# Patient Record
Sex: Male | Born: 1995
Health system: Southern US, Community
[De-identification: ages and names within clinical notes are randomized; demographics above are authoritative.]

## PROBLEM LIST (undated history)

## (undated) DIAGNOSIS — F329 Major depressive disorder, single episode, unspecified: Secondary | ICD-10-CM

## (undated) DIAGNOSIS — F419 Anxiety disorder, unspecified: Secondary | ICD-10-CM

## (undated) DIAGNOSIS — R51 Headache: Secondary | ICD-10-CM

## (undated) DIAGNOSIS — F32A Depression, unspecified: Secondary | ICD-10-CM

## (undated) DIAGNOSIS — R21 Rash and other nonspecific skin eruption: Secondary | ICD-10-CM

## (undated) DIAGNOSIS — R0600 Dyspnea, unspecified: Secondary | ICD-10-CM

## (undated) DIAGNOSIS — G43909 Migraine, unspecified, not intractable, without status migrainosus: Secondary | ICD-10-CM

## (undated) DIAGNOSIS — S62309A Unspecified fracture of unspecified metacarpal bone, initial encounter for closed fracture: Secondary | ICD-10-CM

## (undated) HISTORY — DX: Major depressive disorder, single episode, unspecified: F32.9

## (undated) HISTORY — DX: Depression, unspecified: F32.A

## (undated) HISTORY — DX: Unspecified fracture of unspecified metacarpal bone, initial encounter for closed fracture: S62.309A

## (undated) HISTORY — PX: NASAL SINUS SURGERY: SHX719

## (undated) HISTORY — DX: Anxiety disorder, unspecified: F41.9

## (undated) HISTORY — DX: Headache: R51

---
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
## 2016-02-25 ENCOUNTER — Emergency Department (HOSPITAL_COMMUNITY)
Admission: EM | Admit: 2016-02-25 | Discharge: 2016-02-25 | Disposition: A | Discharge status: Home / Self Care | Payer: Medicaid Other | Attending: Emergency Medicine | Admitting: Emergency Medicine

## 2016-02-25 ENCOUNTER — Encounter (HOSPITAL_COMMUNITY): Payer: Self-pay | Admitting: *Deleted

## 2016-02-25 DIAGNOSIS — R002 Palpitations: Secondary | ICD-10-CM | POA: Diagnosis present

## 2016-02-25 DIAGNOSIS — F1410 Cocaine abuse, uncomplicated: Secondary | ICD-10-CM | POA: Diagnosis not present

## 2016-02-25 DIAGNOSIS — Z79899 Other long term (current) drug therapy: Secondary | ICD-10-CM | POA: Diagnosis not present

## 2016-02-25 DIAGNOSIS — Z87891 Personal history of nicotine dependence: Secondary | ICD-10-CM | POA: Insufficient documentation

## 2016-02-25 DIAGNOSIS — F1290 Cannabis use, unspecified, uncomplicated: Secondary | ICD-10-CM | POA: Diagnosis not present

## 2016-02-25 LAB — CBC WITH DIFFERENTIAL/PLATELET
Basophils Absolute: 0 10*3/uL (ref 0.0–0.1)
Basophils Relative: 0 %
Eosinophils Absolute: 0 10*3/uL (ref 0.0–0.7)
Eosinophils Relative: 0 %
HCT: 44.5 % (ref 39.0–52.0)
Hemoglobin: 15.1 g/dL (ref 13.0–17.0)
Lymphocytes Relative: 6 %
Lymphs Abs: 1.2 10*3/uL (ref 0.7–4.0)
MCH: 27.5 pg (ref 26.0–34.0)
MCHC: 33.9 g/dL (ref 30.0–36.0)
MCV: 80.9 fL (ref 78.0–100.0)
Monocytes Absolute: 1.1 10*3/uL — ABNORMAL HIGH (ref 0.1–1.0)
Monocytes Relative: 6 %
Neutro Abs: 17.2 10*3/uL — ABNORMAL HIGH (ref 1.7–7.7)
Neutrophils Relative %: 88 %
Platelets: 230 10*3/uL (ref 150–400)
RBC: 5.5 MIL/uL (ref 4.22–5.81)
RDW: 15.9 % — ABNORMAL HIGH (ref 11.5–15.5)
WBC: 19.5 10*3/uL — ABNORMAL HIGH (ref 4.0–10.5)

## 2016-02-25 LAB — COMPREHENSIVE METABOLIC PANEL
ALT: 14 U/L — ABNORMAL LOW (ref 17–63)
AST: 20 U/L (ref 15–41)
Albumin: 4.5 g/dL (ref 3.5–5.0)
Alkaline Phosphatase: 72 U/L (ref 38–126)
Anion gap: 10 (ref 5–15)
BUN: 16 mg/dL (ref 6–20)
CO2: 23 mmol/L (ref 22–32)
Calcium: 9.5 mg/dL (ref 8.9–10.3)
Chloride: 103 mmol/L (ref 101–111)
Creatinine, Ser: 1.03 mg/dL (ref 0.61–1.24)
GFR calc Af Amer: >60 mL/min (ref >60)
GFR calc non Af Amer: >60 mL/min (ref >60)
Glucose, Bld: 97 mg/dL (ref 65–99)
Potassium: 3.5 mmol/L (ref 3.5–5.1)
Sodium: 136 mmol/L (ref 135–145)
Total Bilirubin: 0.5 mg/dL (ref 0.3–1.2)
Total Protein: 8.6 g/dL — ABNORMAL HIGH (ref 6.5–8.1)

## 2016-02-25 LAB — SALICYLATE LEVEL: Salicylate Lvl: <7 mg/dL (ref 2.8–30.0)

## 2016-02-25 LAB — ACETAMINOPHEN LEVEL: Acetaminophen (Tylenol), Serum: <10 ug/mL — ABNORMAL LOW (ref 10–30)

## 2016-02-25 LAB — MAGNESIUM: Magnesium: 1.8 mg/dL (ref 1.7–2.4)

## 2016-02-25 NOTE — ED Triage Notes (Signed)
Pt states that he consumed cocaine around 3-4pm and then he had some cough syrup after which he became very jittery and anxious and his heart began racing.  Pt reports that he feels that his chest is burning.

## 2016-02-25 NOTE — ED Provider Notes (Signed)
[facility] DEPT Provider Note   CSN: [id number] Arrival date & time: 02/25/16  1802     History   Chief Complaint Chief Complaint  Patient presents with  . Palpitations    HPI Jorge Mckinney is a 20 y.o. male.  HPI  The patient is a 20 year old male, he has a history of substance abuse stating that he drinks cough syrup cough syrup occasionally but also uses cocaine and states that today between the hours of 4 and 5:00 PM he took between 5 and 6 bumps of cocaine which he inhaled through his nose, he drank half a bottle of Delsym cough syrup and smoked some marijuana. He reports that he was doing this to get high, he does have a history of using drugs and has experimented with some oral medications in the past as well though he denies any of that this evening. He denies hallucinations at this time, denies suicidality, denies any depression and other than feeling like he is having palpitations he has no other complaints. The palpitations are constant, gradually improving, not associated with chest pain difficulty breathing or headache or loss of consciousness.  Past Medical History:  Diagnosis Date  . Anxiety   . Depression   . Headache(784.0)   . Metacarpal bone fracture October 2012   after punching a wall, saw Dr. Lenon Curt    Patient Active Problem List   Diagnosis Date Noted  . Anxiety state, unspecified 11/08/2013  . Auditory hallucinations 11/08/2013    Past Surgical History:  Procedure Laterality Date  . NASAL SINUS SURGERY         Home Medications    Prior to Admission medications   Medication Sig Start Date End Date Taking? Authorizing Provider  dextromethorphan (DELSYM) 30 MG/5ML liquid Take 30 mg by mouth daily as needed for cough.   Yes Historical Provider, MD    Family History Family History  Problem Relation Age of Onset  . Depression Father     Social History Social History  Substance Use Topics  . Smoking status: Former Research scientist (life sciences)  .  Smokeless tobacco: Never Used  . Alcohol use No     Allergies   Patient has no known allergies.   Review of Systems Review of Systems  All other systems reviewed and are negative.    Physical Exam Updated Vital Signs BP 122/68   Pulse 77   Temp 98.3 F (36.8 C) (Oral)   Resp 20   Wt 140 lb (63.5 kg)   SpO2 99%   BMI 20.09 kg/m   Physical Exam  Constitutional: He appears well-developed and well-nourished. No distress.  HENT:  Head: Normocephalic and atraumatic.  Mouth/Throat: Oropharynx is clear and moist. No oropharyngeal exudate.  Eyes: Conjunctivae and EOM are normal. Pupils are equal, round, and reactive to light. Right eye exhibits no discharge. Left eye exhibits no discharge. No scleral icterus.  Neck: Normal range of motion. Neck supple. No JVD present. No thyromegaly present.  Cardiovascular: Normal rate, regular rhythm, normal heart sounds and intact distal pulses.  Exam reveals no gallop and no friction rub.   No murmur heard. Heart rate of 95 on my exam with strong pulses peripherally, no JVD, no edema, no murmurs  Pulmonary/Chest: Effort normal and breath sounds normal. No respiratory distress. He has no wheezes. He has no rales.  Abdominal: Soft. Bowel sounds are normal. He exhibits no distension and no mass. There is no tenderness.  Musculoskeletal: Normal range of motion. He exhibits no edema or  tenderness.  Lymphadenopathy:    He has no cervical adenopathy.  Neurological: He is alert. Coordination normal.  The patient is alert but tired appearing, follows commands without difficulty, speech is not slurred but clear  Skin: Skin is warm and dry. No rash noted. No erythema.  Psychiatric: He has a normal mood and affect. His behavior is normal.  Nursing note and vitals reviewed.    ED Treatments / Results  Labs (all labs ordered are listed, but only abnormal results are displayed) Labs Reviewed  ACETAMINOPHEN LEVEL - Abnormal; Notable for the  following:       Result Value   Acetaminophen (Tylenol), Serum <10 (*)    All other components within normal limits  COMPREHENSIVE METABOLIC PANEL - Abnormal; Notable for the following:    Total Protein 8.6 (*)    ALT 14 (*)    All other components within normal limits  CBC WITH DIFFERENTIAL/PLATELET - Abnormal; Notable for the following:    WBC 19.5 (*)    RDW 15.9 (*)    Neutro Abs 17.2 (*)    Monocytes Absolute 1.1 (*)    All other components within normal limits  SALICYLATE LEVEL  MAGNESIUM    EKG  EKG Interpretation  Date/Time:  Monday February 25 2016 18:14:00 EST Ventricular Rate:  118 PR Interval:  116 QRS Duration: 76 QT Interval:  380 QTC Calculation: 532 R Axis:   85 Text Interpretation:  Sinus tachycardia Right atrial enlargement Prolonged QT Abnormal ECG No old tracing to compare Confirmed by Tyrice Hewitt  MD, [place] ([id number]) on 02/25/2016 6:53:15 PM       EKG Interpretation  Date/Time:  Monday February 25 2016 22:08:52 EST Ventricular Rate:  80 PR Interval:  116 QRS Duration: 77 QT Interval:  386 QTC Calculation: 446 R Axis:   82 Text Interpretation:  Sinus rhythm RSR' in V1 or V2, probably normal variant Nonspecific T abnrm, anterolateral leads Baseline wander in lead(s) V4 Since last tracing rate slower and QT has shortened to normal length. Confirmed by Sabra Heck  MD, [place] ([id number]) on 02/25/2016 10:23:21 PM        Radiology No results found.  Procedures Procedures (including critical care time)  Medications Ordered in ED Medications - No data to display   Initial Impression / Assessment and Plan / ED Course  I have reviewed the triage vital signs and the nursing notes.  Pertinent labs & imaging results that were available during my care of the patient were reviewed by me and considered in my medical decision making (see chart for details).  Clinical Course     The patient has an abnormal EKG, there is a prolonged QT with a rate of 118 and a QTC  of 532. The patient will need to have his care discussed with the [facility], he'll stay on a cardiac monitor, he is not hallucinating and this was not a suicide attempt. Labs pending  [facility] recommended observatin and repeat ECG Repeat ECG has improved QT interval - back to normal  Pt stable for d/c Given resources for substance abuse treatment.  Final Clinical Impressions(s) / ED Diagnoses   Final diagnoses:  Palpitations  Cocaine abuse    New Prescriptions Discharge Medication List as of 02/25/2016 10:25 PM       Noemi Chapel, MD 02/26/16 [phone]

## 2016-02-25 NOTE — ED Notes (Signed)
Moved pt to room per EDP after EKG was given to Dr. Sabra Heck.  Pt is in no acute distress.  Mother at the bedside with him

## 2016-02-25 NOTE — Discharge Instructions (Signed)
Stop using drugs See the list below for follow up for substance abuse.  Substance Abuse Treatment Programs  Intensive Outpatient Programs [facility]     [address], [place]       [facility] [address] [place], [place]  [facility]     (Inpatient and outpatient)     [address]           [place] [phone] (Suboxone and Methadone)  [place], [place] [address]      [place] [address] [phone] [place], [place]  [facility] (Outpatient/Inpatient, [place])    (insurance only) [phone]             [facility] ([place]) [place], [place]     [facility]     [address]     [place], [place]       [facility] (for caregivers and family) [phone] [address] Kristeen Mans. [place], [place]      Residential Treatment Programs [facility]      [address] [place], [place] [address]  [phone]       [facility] [address] [place], [place] [address] [phone]  [facility]        [phone]       [facility] [phone]  [facility] ([place]             [place], [place] or [place] of [place], [address] [phone]  [facility]    [address]      [place], [place]       [facility] [address] [place], [place]  [place]   [address] [place], [place] [address]     [phone]      Admissions: 8am-3pm  M-F  [facility] (RTS) [address] [place], [place]  [facility] Program: Residential Program [phone] Days)   [place], [place] or [phone]     [facility]: [facility] [place], [place] (Walk in Hours over the weekend or by referral)  [facility] [place], [place], [place] [address] [phone]  Crisis Mobile: [facility]:  [phone] (for crisis response 24 hours a day) Anon [place]:  [phone] Outpatient Psychiatry and Counseling  [facility]: Mobile Crisis Management 24 hours:  [phone]  [facility] the [employer] sliding scale fee and walk in schedule: M-F 8am-12pm/1pm-3pm [place], [place] [address] [place], [place] [address] [phone]  [facility] ([facility])- new patient walk-in appointments available Monday - Friday 8am -3pm.          [address] [place], [place] [address] [phone] or crisis line- [facility] [facility]/ Intensive Outpatient Therapy Program [place], [place] [address] [place]      [phone] [address] [place], [place] [address]                 [place]   [facility] [phone]. [address] [place], [place] [address]   [employer] of Care          [address] Johnette Abraham  [place], [place] [address]       [phone]  [facility] [address], [place], [place] [address] [phone]  [facility]    [address] [place], [place] [address]     [place], [place] Joycelyn Man     [place]  [address]     [phone]       [facility] [place] [address]  [facility]     [address], [place], [place], [place] [address] [place]     [address]     [place], [place] [address]     [phone]       [facility] [address] [place], [place] [address] [phone] Resources for Temporary Residential Assistance/Crisis [place] [facility]) M-F 8am-3pm   [address], [place] [address]   [phone] Services include: laundry, barbering, support groups, case management, phone  & computer access, showers, AA/NA mtgs, mental health/substance abuse nurse, job skills class, disability information, VA assistance, spiritual classes, etc.   [facility]   [address], [place]     [place]              [employer] (women and children)       [place]. [place], [place] [address] [phone] [email or web address] for application and process Application Required  [facility]   [address]    [place] [address]     [phone]                    [place], [place] [address] [id number] application appt.) Application Required  [employer] (women only)    [place], [place]  [place]      Intake starts 6pm daily Need valid ID, SSC, & Police report [employer] [address] [place], [place] [id number] Application Required  [employer] (men only)     [place]      [place], [place]       [place] (Pregnant  women only) [address] [place], [place]  [facility]      [place] [address]      [place], [place] [address]     [phone]             [facility] [address] [place], [place] 90 day commitment/SA/Application process  [facility](men only)     [address]     [place], [place]       Check-in at [facility] [address] [place], [place] [address] [phone] Men/Women/Women and Children must be there by 7 pm  [place], [place]

## 2016-02-25 NOTE — ED Notes (Signed)
Cecille Rubin from [employer] called for patient update

## 2016-02-25 NOTE — ED Notes (Signed)
Spoke with Jorge Mckinney from [employer], she stated pt would need to be observed for 4 more hours (6-8 hours total after initial ingestion), pt could be given benzos for any agitation, a magnesium level should be drawn, another EKG 3 hours after first EKG done to make sure QT was less than 500; pt is encouraged to drink fluids;  Dr. Sabra Heck informed of Poison Control orders

## 2016-03-31 DIAGNOSIS — S060X9A Concussion with loss of consciousness of unspecified duration, initial encounter: Secondary | ICD-10-CM

## 2016-03-31 DIAGNOSIS — S060XAA Concussion with loss of consciousness status unknown, initial encounter: Secondary | ICD-10-CM

## 2016-03-31 HISTORY — DX: Concussion with loss of consciousness of unspecified duration, initial encounter: S06.0X9A

## 2016-03-31 HISTORY — DX: Concussion with loss of consciousness status unknown, initial encounter: S06.0XAA

## 2016-04-08 ENCOUNTER — Emergency Department (HOSPITAL_COMMUNITY)
Admission: EM | Admit: 2016-04-08 | Discharge: 2016-04-08 | Disposition: A | Discharge status: Home / Self Care | Payer: Medicaid Other | Attending: Emergency Medicine | Admitting: Emergency Medicine

## 2016-04-08 ENCOUNTER — Encounter (HOSPITAL_COMMUNITY): Payer: Self-pay | Admitting: Emergency Medicine

## 2016-04-08 DIAGNOSIS — R531 Weakness: Secondary | ICD-10-CM | POA: Diagnosis not present

## 2016-04-08 DIAGNOSIS — F1290 Cannabis use, unspecified, uncomplicated: Secondary | ICD-10-CM | POA: Diagnosis not present

## 2016-04-08 DIAGNOSIS — Z87891 Personal history of nicotine dependence: Secondary | ICD-10-CM | POA: Diagnosis not present

## 2016-04-08 DIAGNOSIS — Z5181 Encounter for therapeutic drug level monitoring: Secondary | ICD-10-CM | POA: Diagnosis not present

## 2016-04-08 LAB — URINALYSIS, ROUTINE W REFLEX MICROSCOPIC
Bilirubin Urine: NEGATIVE
Glucose, UA: NEGATIVE mg/dL
Hgb urine dipstick: NEGATIVE
Ketones, ur: 5 mg/dL — AB
Leukocytes, UA: NEGATIVE
Nitrite: NEGATIVE
Protein, ur: NEGATIVE mg/dL
Specific Gravity, Urine: 1.023 (ref 1.005–1.030)
pH: 6 (ref 5.0–8.0)

## 2016-04-08 LAB — BASIC METABOLIC PANEL
Anion gap: 11 (ref 5–15)
BUN: 12 mg/dL (ref 6–20)
CO2: 24 mmol/L (ref 22–32)
Calcium: 9.8 mg/dL (ref 8.9–10.3)
Chloride: 100 mmol/L — ABNORMAL LOW (ref 101–111)
Creatinine, Ser: 1.08 mg/dL (ref 0.61–1.24)
GFR calc Af Amer: >60 mL/min (ref >60)
GFR calc non Af Amer: >60 mL/min (ref >60)
Glucose, Bld: 87 mg/dL (ref 65–99)
Potassium: 3.6 mmol/L (ref 3.5–5.1)
Sodium: 135 mmol/L (ref 135–145)

## 2016-04-08 LAB — CBC WITH DIFFERENTIAL/PLATELET
Basophils Absolute: 0 10*3/uL (ref 0.0–0.1)
Basophils Relative: 0 %
Eosinophils Absolute: 0.1 10*3/uL (ref 0.0–0.7)
Eosinophils Relative: 1 %
HCT: 49.9 % (ref 39.0–52.0)
Hemoglobin: 17.2 g/dL — ABNORMAL HIGH (ref 13.0–17.0)
Lymphocytes Relative: 16 %
Lymphs Abs: 1.5 10*3/uL (ref 0.7–4.0)
MCH: 27.7 pg (ref 26.0–34.0)
MCHC: 34.5 g/dL (ref 30.0–36.0)
MCV: 80.4 fL (ref 78.0–100.0)
Monocytes Absolute: 0.9 10*3/uL (ref 0.1–1.0)
Monocytes Relative: 9 %
Neutro Abs: 6.9 10*3/uL (ref 1.7–7.7)
Neutrophils Relative %: 74 %
Platelets: 223 10*3/uL (ref 150–400)
RBC: 6.21 MIL/uL — ABNORMAL HIGH (ref 4.22–5.81)
RDW: 15.3 % (ref 11.5–15.5)
WBC: 9.3 10*3/uL (ref 4.0–10.5)

## 2016-04-08 LAB — RAPID URINE DRUG SCREEN, HOSP PERFORMED
Amphetamines: NOT DETECTED
Barbiturates: NOT DETECTED
Benzodiazepines: NOT DETECTED
Cocaine: NOT DETECTED
Opiates: NOT DETECTED
Tetrahydrocannabinol: POSITIVE — AB

## 2016-04-08 LAB — CK: Total CK: 146 U/L (ref 49–397)

## 2016-04-08 MED ORDER — SODIUM CHLORIDE 0.9 % IV BOLUS (SEPSIS)
1000.0000 mL | Freq: Once | INTRAVENOUS | Status: AC
  Administered 2016-04-08: 1000 mL via INTRAVENOUS

## 2016-04-08 NOTE — ED Provider Notes (Signed)
[facility] DEPT Provider Note   CSN: [id number] Arrival date & time: 04/08/16  1759     History   Chief Complaint Chief Complaint  Patient presents with  . Weakness    HPI LADARIAN TUR is a 21 y.o. male.  HPI   The patient is a 21 year old male, he does have a history of anxiety and depression as well as a history of substance abuse. I personally saw this patient approximately 5-6 weeks ago at which time the patient had been using cocaine. He has not used since that time but has been smoking marijuana, he most recently smoked 2 days ago after which time he felt jittery and has had generalized fatigue. This generalized fatigue is expressed his general weakness, he reports that his arms and felt weak, he notes that these arms are persistently weak. He denies any numbness in the arms or the legs, no difficulty walking other than feeling generally weak and lightheaded. He denies any difficulty using his legs, difficulty using his speech, difficulty with vision. He has been able to eat and drink okay. He states that after his encounter with cocaine during the last visit he went to see day Elta Guadeloupe to try to get help and states that he was discouraged and did not go back, he did not seek further help and has not gone inpatient or started an outpatient treatment program.  The patient denies hallucinations or worsening depression.  Past Medical History:  Diagnosis Date  . Anxiety   . Depression   . Headache(784.0)   . Metacarpal bone fracture October 2012   after punching a wall, saw Dr. Lenon Curt    Patient Active Problem List   Diagnosis Date Noted  . Anxiety state, unspecified 11/08/2013  . Auditory hallucinations 11/08/2013    Past Surgical History:  Procedure Laterality Date  . NASAL SINUS SURGERY         Home Medications    Prior to Admission medications   Medication Sig Start Date End Date Taking? Authorizing Provider  dextromethorphan (DELSYM) 30 MG/5ML liquid Take  30 mg by mouth daily as needed for cough.    Historical Provider, MD    Family History Family History  Problem Relation Age of Onset  . Depression Father     Social History Social History  Substance Use Topics  . Smoking status: Former Research scientist (life sciences)  . Smokeless tobacco: Never Used  . Alcohol use Yes     Comment: occasionally     Allergies   Patient has no known allergies.   Review of Systems Review of Systems  All other systems reviewed and are negative.    Physical Exam Updated Vital Signs BP 120/73   Pulse 63   Temp 97.7 F (36.5 C) (Oral)   Resp 16   Ht 5\' 6"  (1.676 m)   Wt 140 lb (63.5 kg)   SpO2 100%   BMI 22.60 kg/m   Physical Exam  Constitutional: He appears well-developed and well-nourished. No distress.  HENT:  Head: Normocephalic and atraumatic.  Mouth/Throat: Oropharynx is clear and moist. No oropharyngeal exudate.  Eyes: Conjunctivae and EOM are normal. Pupils are equal, round, and reactive to light. Right eye exhibits no discharge. Left eye exhibits no discharge. No scleral icterus.  Neck: Normal range of motion. Neck supple. No JVD present. No thyromegaly present.  Cardiovascular: Normal rate, regular rhythm, normal heart sounds and intact distal pulses.  Exam reveals no gallop and no friction rub.   No murmur heard. Pulmonary/Chest: Effort  normal and breath sounds normal. No respiratory distress. He has no wheezes. He has no rales.  Abdominal: Soft. Bowel sounds are normal. He exhibits no distension and no mass. There is no tenderness.  Musculoskeletal: Normal range of motion. He exhibits no edema or tenderness.  Lymphadenopathy:    He has no cervical adenopathy.  Neurological: He is alert. Coordination normal.  Speech is clear, cranial nerves III through XII are intact, memory is intact, strength is normal in all 4 extremities including grips, sensation is intact to light touch and pinprick in all 4 extremities. Coordination as tested by  finger-nose-finger is normal, no limb ataxia. Normal gait, normal reflexes at the patellar tendons bilaterally  Skin: Skin is warm and dry. No rash noted. No erythema.  Psychiatric: He has a normal mood and affect. His behavior is normal.  Nursing note and vitals reviewed.    ED Treatments / Results  Labs (all labs ordered are listed, but only abnormal results are displayed) Labs Reviewed  CBC WITH DIFFERENTIAL/PLATELET - Abnormal; Notable for the following:       Result Value   RBC 6.21 (*)    Hemoglobin 17.2 (*)    All other components within normal limits  BASIC METABOLIC PANEL - Abnormal; Notable for the following:    Chloride 100 (*)    All other components within normal limits  RAPID URINE DRUG SCREEN, HOSP PERFORMED - Abnormal; Notable for the following:    Tetrahydrocannabinol POSITIVE (*)    All other components within normal limits  URINALYSIS, ROUTINE W REFLEX MICROSCOPIC - Abnormal; Notable for the following:    Ketones, ur 5 (*)    All other components within normal limits  CK     Radiology No results found.  Procedures Procedures (including critical care time)  Medications Ordered in ED Medications  sodium chloride 0.9 % bolus 1,000 mL (1,000 mLs Intravenous New Bag/Given 04/08/16 1929)  sodium chloride 0.9 % bolus 1,000 mL (0 mLs Intravenous Stopped 04/08/16 2024)     Initial Impression / Assessment and Plan / ED Course  I have reviewed the triage vital signs and the nursing notes.  Pertinent labs & imaging results that were available during my care of the patient were reviewed by me and considered in my medical decision making (see chart for details).  Clinical Course     The patient's exam is unremarkable, he has a normal neurologic exam with totally normal strength of his bilateral upper extremities with normal grips, normal strength at the shoulders elbows and wrists, normal strength at the hips knees and ankles. He has a normal cardiac and pulmonary  exam. His mental status is normal, his affect is slightly flat but similar to when I last saw him.  He does not have any myalgias, no fever, no other symptoms other than some mild diarrhea today. At this time the patient will be evaluated with labs to evaluate for his fatigue and generalized weakness, check urinalysis and give some gentle hydration. The patient is in agreement, he appears well and has no abnormal vital signs. I have again counseled the patient regarding substance abuse.   Labs neg Pt stable Hydration given Return as needed Psych recommendations given and accepted   Final Clinical Impressions(s) / ED Diagnoses   Final diagnoses:  Weakness    New Prescriptions New Prescriptions   No medications on file     Noemi Chapel, MD 04/08/16 2036

## 2016-04-08 NOTE — Discharge Instructions (Signed)
Your testing was normal Please see attached list for substance abuse Please avoid using drugs including Marijuana  Please obtain all of your results from medical records or have your doctors office obtain the results - share them with your doctor - you should be seen at your doctors office in the next 2 days. Call today to arrange your follow up. Take the medications as prescribed. Please review all of the medicines and only take them if you do not have an allergy to them. Please be aware that if you are taking birth control pills, taking other prescriptions, ESPECIALLY ANTIBIOTICS may make the birth control ineffective - if this is the case, either do not engage in sexual activity or use alternative methods of birth control such as condoms until you have finished the medicine and your family doctor says it is OK to restart them. If you are on a blood thinner such as COUMADIN, be aware that any other medicine that you take may cause the coumadin to either work too much, or not enough - you should have your coumadin level rechecked in next 7 days if this is the case.  ?  It is also a possibility that you have an allergic reaction to any of the medicines that you have been prescribed - Everybody reacts differently to medications and while MOST people have no trouble with most medicines, you may have a reaction such as nausea, vomiting, rash, swelling, shortness of breath. If this is the case, please stop taking the medicine immediately and contact your physician.  ?  You should return to the ER if you develop severe or worsening symptoms.   Substance Abuse Treatment Programs  Intensive Outpatient Programs [facility]     [address], [place]       [facility] [address] [place], [place]  [facility]     (Inpatient and outpatient)     [address]  Dr.           [place] [phone] (Suboxone and Methadone)  [place], [place] [address]      [place] [phone] [place], [place]  [facility] (Outpatient/Inpatient, Chemical)    (insurance only) [phone]             [facility] ([place]) [place], [place]     [facility]     [address]     [place], [place]       [facility] (for caregivers and family) [phone] [address] Kristeen Mans. [place], [place]      Residential Treatment Programs [facility]      [address], [place], [place] [address]  [phone]       [facility] [address], [place], [place] [address] [phone]  [facility]        [phone]       [facility] [phone]  [facility] ([place]             [address]  [place], [place] or [place], [address] [phone]  [facility]    [address]      [place], [place]       [facility] [address] [place], [place]  [place]   [address] [place], [place] [address]     [phone]      Admissions: 8am-3pm M-F  [facility] (RTS) [address] [place], [place]  [facility] Program: Residential Program [phone] Days)   [place], [place] or [phone]     [facility]: [place], [place] (Walk in Hours over the weekend or by referral)  [facility] [place], [place], [place] [address] [phone]  Crisis Mobile: Therapeutic  Alternatives:  [phone] (for crisis response 24 hours a day) [facility] Hotline:      [phone] Outpatient Psychiatry and Counseling  [facility]: Mobile Crisis Management 24 hours:  [phone]  [facility] of the [employer] sliding scale fee and walk in schedule: M-F 8am-12pm/1pm-3pm [place], [place] [address] [place], [place] [address] [phone]  [facility] (Formerly known as [facility] [employer])- new patient walk-in appointments available Monday - Friday 8am -3pm.          [address] [place], [place] [address] [phone] or crisis line- [facility]/ Intensive Outpatient Therapy Program [place], [place] [address] [place]      [phone] [address] [place], [place] [address]                 [place]   [facility] [phone]. [address] [place], [place] [address]   [employer] of Care          [address] Johnette Abraham  [place], [place] [address]       [phone]  [facility] [address] [place], [place] [address] [phone]  [facility]    [address] [place], [place] [address]     [place], [place] Joycelyn Man     [place] [address]     [phone]       [facility] [place] [address]  [facility]     [address], [place], [place]  [place], [place] [address] [place]     [address]     [place], [place]  [address]     [phone]       [facility] [address] [place], [place] [address] [phone] Resources for Temporary Residential Assistance/Crisis [facility]) M-F 8am-3pm   [address] [place], [place] [address]   [phone] Services include: laundry, barbering, support groups, case management, phone  & computer access, showers, AA/NA mtgs, mental health/substance abuse nurse, job skills class, disability information, VA assistance, spiritual classes, etc.   [facility]   [address] [place]     [place]              [employer] (women and children)       [place]. [place], [place] [address] [phone] [email or web address] for application and process Application Required  [facility]   [address]    [place] [address]     [phone]                    [place], [place] [address] [phone]([email or web address] application appt.) Application Required  [facility] (women only)    [address]     [place], [place] [address]     [phone]      Intake starts 6pm daily Need valid ID, SSC, & Police report [employer] [address] [place], [place] [phone] Application Required  [employer] (men only)     [place].      [place], [place]       [place] (Pregnant women only) [address] [place], [place]  [facility]      [place] [address].      [place], [place] [address]     [phone]             [facility] [address] [place], [place] 90 day commitment/SA/Application process  [facility](men only)     [address]     [place],  [place]       Check-in at [facility] of [facility] [address] [place],  [address] [phone] Men/Women/Women and Children must be there by 7 pm  [place], [place]

## 2016-04-08 NOTE — ED Triage Notes (Signed)
Patient complaining of weakness x 2 days. Mother states "he smoked some weed 2 days ago and was shaking and ever since he's been feeling weak." Denies pain at this time.

## 2016-04-08 NOTE — ED Notes (Signed)
ED Provider at bedside. 

## 2016-04-17 ENCOUNTER — Emergency Department (HOSPITAL_COMMUNITY): Payer: Medicaid Other

## 2016-04-17 ENCOUNTER — Encounter (HOSPITAL_COMMUNITY): Payer: Self-pay | Admitting: *Deleted

## 2016-04-17 ENCOUNTER — Emergency Department (HOSPITAL_COMMUNITY)
Admission: EM | Admit: 2016-04-17 | Discharge: 2016-04-17 | Disposition: A | Discharge status: Home / Self Care | Payer: Medicaid Other | Attending: Emergency Medicine | Admitting: Emergency Medicine

## 2016-04-17 DIAGNOSIS — J069 Acute upper respiratory infection, unspecified: Secondary | ICD-10-CM | POA: Diagnosis not present

## 2016-04-17 DIAGNOSIS — R509 Fever, unspecified: Secondary | ICD-10-CM | POA: Diagnosis present

## 2016-04-17 DIAGNOSIS — Z79899 Other long term (current) drug therapy: Secondary | ICD-10-CM | POA: Diagnosis not present

## 2016-04-17 DIAGNOSIS — Z87891 Personal history of nicotine dependence: Secondary | ICD-10-CM | POA: Insufficient documentation

## 2016-04-17 DIAGNOSIS — F1290 Cannabis use, unspecified, uncomplicated: Secondary | ICD-10-CM | POA: Insufficient documentation

## 2016-04-17 DIAGNOSIS — F1490 Cocaine use, unspecified, uncomplicated: Secondary | ICD-10-CM | POA: Insufficient documentation

## 2016-04-17 LAB — RAPID STREP SCREEN (MED CTR MEBANE ONLY): Streptococcus, Group A Screen (Direct): NEGATIVE

## 2016-04-17 NOTE — Discharge Instructions (Signed)
Please read and follow all provided instructions.  Your diagnoses today include:  1. Upper respiratory tract infection, unspecified type     Tests performed today include: Vital signs. See below for your results today.   Medications prescribed:  Take as prescribed   Home care instructions:  Follow any educational materials contained in this packet.  Follow-up instructions: Please follow-up with your primary care provider for further evaluation of symptoms and treatment   Return instructions:  Please return to the Emergency Department if you do not get better, if you get worse, or new symptoms OR  - Fever (temperature greater than 101.44F)  - Bleeding that does not stop with holding pressure to the area    -Severe pain (please note that you may be more sore the day after your accident)  - Chest Pain  - Difficulty breathing  - Severe nausea or vomiting  - Inability to tolerate food and liquids  - Passing out  - Skin becoming red around your wounds  - Change in mental status (confusion or lethargy)  - New numbness or weakness    Please return if you have any other emergent concerns.  Additional Information:  Your vital signs today were: BP 122/75    Pulse 69    Temp 100.4 F (38 C) (Oral)    Resp 16    Ht 6' (1.829 m)    Wt 63.5 kg    SpO2 99%    BMI 18.99 kg/m  If your blood pressure (BP) was elevated above 135/85 this visit, please have this repeated by your doctor within one month. ---------------

## 2016-04-17 NOTE — ED Provider Notes (Signed)
[facility] Provider Note   CSN: [id number] Arrival date & time: 04/17/16  [id number]     History   Chief Complaint Chief Complaint  Patient presents with  . Fever    HPI Jorge Mckinney is a 21 y.o. male.  HPI  21 y.o. male, presents to the Emergency Department today complaining of fever, cough, body aches x 2 days. Associated sore throat. No N/V/D. No CP/SOB/ABD pain. No sick contacts. No hx immunosuppression. No other symptoms noted   Past Medical History:  Diagnosis Date  . Anxiety   . Depression   . Headache(784.0)   . Metacarpal bone fracture October 2012   after punching a wall, saw Dr. Lenon Curt    Patient Active Problem List   Diagnosis Date Noted  . Anxiety state, unspecified 11/08/2013  . Auditory hallucinations 11/08/2013    Past Surgical History:  Procedure Laterality Date  . NASAL SINUS SURGERY         Home Medications    Prior to Admission medications   Medication Sig Start Date End Date Taking? Authorizing Provider  dextromethorphan (DELSYM) 30 MG/5ML liquid Take 30 mg by mouth daily as needed for cough.    Historical Provider, MD    Family History Family History  Problem Relation Age of Onset  . Depression Father     Social History Social History  Substance Use Topics  . Smoking status: Former Research scientist (life sciences)  . Smokeless tobacco: Never Used  . Alcohol use Yes     Comment: occasionally     Allergies   Patient has no known allergies.   Review of Systems Review of Systems  Constitutional: Positive for fever.  HENT: Positive for congestion and rhinorrhea.   Respiratory: Positive for cough. Negative for shortness of breath.   Cardiovascular: Negative for chest pain.  Gastrointestinal: Negative for diarrhea, nausea and vomiting.  Allergic/Immunologic: Negative for immunocompromised state.     Physical Exam Updated Vital Signs BP 122/75   Pulse 69   Temp 100.4 F (38 C) (Oral)   Resp 16   Ht 6' (1.829 m)   Wt 63.5 kg   SpO2  99%   BMI 18.99 kg/m   Physical Exam  Constitutional: He is oriented to person, place, and time. He appears well-developed and well-nourished. No distress.  HENT:  Head: Normocephalic and atraumatic.  Right Ear: Tympanic membrane, external ear and ear canal normal.  Left Ear: Tympanic membrane, external ear and ear canal normal.  Nose: Nose normal.  Mouth/Throat: Uvula is midline and mucous membranes are normal. No trismus in the jaw. Posterior oropharyngeal erythema present. No oropharyngeal exudate or tonsillar abscesses.  Eyes: EOM are normal. Pupils are equal, round, and reactive to light.  Neck: Normal range of motion. Neck supple. No tracheal deviation present.  Cardiovascular: Normal rate, regular rhythm, S1 normal, S2 normal, normal heart sounds, intact distal pulses and normal pulses.   Pulmonary/Chest: Effort normal and breath sounds normal. No respiratory distress. He has no decreased breath sounds. He has no wheezes. He has no rhonchi. He has no rales.  Abdominal: Normal appearance and bowel sounds are normal. There is no tenderness.  Musculoskeletal: Normal range of motion.  Neurological: He is alert and oriented to person, place, and time.  Skin: Skin is warm and dry.  Psychiatric: He has a normal mood and affect. His speech is normal and behavior is normal. Thought content normal.   ED Treatments / Results  Labs (all labs ordered are listed, but only abnormal results  are displayed) Labs Reviewed  RAPID STREP SCREEN (NOT AT [facility])  CULTURE, GROUP A STREP [facility])    EKG  EKG Interpretation None       Radiology Dg Chest 2 View  Result Date: 04/17/2016 CLINICAL DATA:  Fever.  Nonproductive cough . EXAM: CHEST  2 VIEW COMPARISON:  02/03/2011. FINDINGS: Mediastinum hilar structures normal. Lungs are clear. Symmetric bilateral faint nodular opacities noted over the lung bases consistent with nipple shadows. No pleural effusion pneumothorax. IMPRESSION: No acute  cardiopulmonary disease. Electronically Signed   By: Jorge Mckinney  Register   On: 04/17/2016 10:43    Procedures Procedures (including critical care time)  Medications Ordered in ED Medications - No data to display   Initial Impression / Assessment and Plan / ED Course  I have reviewed the triage vital signs and the nursing notes.  Pertinent labs & imaging results that were available during my care of the patient were reviewed by me and considered in my medical decision making (see chart for details).    Final Clinical Impressions(s) / ED Diagnoses  {I have reviewed and evaluated the relevant laboratory values. {I have reviewed and evaluated the relevant imaging studies.  {I have reviewed the relevant previous healthcare records.  {I obtained HPI from historian.   ED Course:  Assessment: Pt is a 20yM presents with URi symptoms x 2 days . On exam, pt in NAD. VSS. Afebrile. Lungs CTA, Heart RRR. Abdomen nontender/soft. Pt CXR negative for acute infiltrate. Rapid Strep negative. Patients symptoms are consistent with URI, likely viral etiology. Discussed that antibiotics are not indicated for viral infections. Pt will be discharged with symptomatic treatment.  Verbalizes understanding and is agreeable with plan. Pt is hemodynamically stable & in NAD prior to dc.  Disposition/Plan:  DC Home Additional Verbal discharge instructions given and discussed with patient.  Pt Instructed to f/u with PCP in the next week for evaluation and treatment of symptoms. Return precautions given Pt acknowledges and agrees with plan  Supervising Physician Francine Graven, DO  Final diagnoses:  Upper respiratory tract infection, unspecified type    New Prescriptions New Prescriptions   No medications on file     Shary Decamp, PA-C 04/17/16 [place], DO 04/20/16 1757

## 2016-04-17 NOTE — ED Triage Notes (Signed)
Pt c/o fever, non productive cough, headache, slight body aches, chest congestion that started 2 days ago. Pt didn't actually take temperature at home but reports feeling hot and then cold. Temp 100.4 in triage.

## 2016-04-17 NOTE — ED Notes (Signed)
Patient given discharge instruction, verbalized understand. Patient ambulatory out of the department.  

## 2016-04-19 LAB — CULTURE, GROUP A STREP (THRC)

## 2016-06-23 ENCOUNTER — Ambulatory Visit: Payer: Medicaid Other

## 2017-03-13 ENCOUNTER — Emergency Department (HOSPITAL_COMMUNITY)
Admission: EM | Admit: 2017-03-13 | Discharge: 2017-03-13 | Disposition: A | Discharge status: Home / Self Care | Payer: Medicaid Other | Attending: Emergency Medicine | Admitting: Emergency Medicine

## 2017-03-13 ENCOUNTER — Encounter (HOSPITAL_COMMUNITY): Payer: Self-pay | Admitting: *Deleted

## 2017-03-13 ENCOUNTER — Other Ambulatory Visit: Payer: Self-pay

## 2017-03-13 ENCOUNTER — Emergency Department (HOSPITAL_COMMUNITY): Payer: Medicaid Other

## 2017-03-13 DIAGNOSIS — Y929 Unspecified place or not applicable: Secondary | ICD-10-CM | POA: Diagnosis not present

## 2017-03-13 DIAGNOSIS — S0181XA Laceration without foreign body of other part of head, initial encounter: Secondary | ICD-10-CM | POA: Insufficient documentation

## 2017-03-13 DIAGNOSIS — Z23 Encounter for immunization: Secondary | ICD-10-CM | POA: Diagnosis not present

## 2017-03-13 DIAGNOSIS — Y999 Unspecified external cause status: Secondary | ICD-10-CM | POA: Diagnosis not present

## 2017-03-13 DIAGNOSIS — Z87891 Personal history of nicotine dependence: Secondary | ICD-10-CM | POA: Insufficient documentation

## 2017-03-13 DIAGNOSIS — Y939 Activity, unspecified: Secondary | ICD-10-CM | POA: Diagnosis not present

## 2017-03-13 MED ORDER — HYDROCODONE-ACETAMINOPHEN 5-325 MG PO TABS
1.0000 | ORAL_TABLET | Freq: Once | ORAL | Status: AC
  Administered 2017-03-13: 1 via ORAL
  Filled 2017-03-13: qty 1

## 2017-03-13 MED ORDER — SILVER NITRATE-POT NITRATE 75-25 % EX MISC
CUTANEOUS | Status: AC
  Administered 2017-03-13: 20:00:00
  Filled 2017-03-13: qty 1

## 2017-03-13 MED ORDER — LIDOCAINE HCL (PF) 2 % IJ SOLN
INTRAMUSCULAR | Status: AC
  Administered 2017-03-13: 19:00:00
  Filled 2017-03-13: qty 10

## 2017-03-13 MED ORDER — POVIDONE-IODINE 10 % EX SOLN
CUTANEOUS | Status: AC
  Administered 2017-03-13: 19:00:00
  Filled 2017-03-13: qty 45

## 2017-03-13 MED ORDER — METHOCARBAMOL 500 MG PO TABS: 1000.0000 mg | ORAL_TABLET | Freq: Four times a day (QID) | ORAL | 0 refills | Status: DC | PRN

## 2017-03-13 MED ORDER — HYDROCODONE-ACETAMINOPHEN 5-325 MG PO TABS: ORAL_TABLET | ORAL | 0 refills | Status: DC

## 2017-03-13 MED ORDER — KETOROLAC TROMETHAMINE 60 MG/2ML IM SOLN
60.0000 mg | Freq: Once | INTRAMUSCULAR | Status: AC
  Administered 2017-03-13: 60 mg via INTRAMUSCULAR
  Filled 2017-03-13: qty 2

## 2017-03-13 MED ORDER — LIDOCAINE HCL (PF) 2 % IJ SOLN: 10.0000 mL | Freq: Once | INTRAMUSCULAR | Status: DC

## 2017-03-13 MED ORDER — TETANUS-DIPHTH-ACELL PERTUSSIS 5-2.5-18.5 LF-MCG/0.5 IM SUSP
0.5000 mL | Freq: Once | INTRAMUSCULAR | Status: AC
  Administered 2017-03-13: 0.5 mL via INTRAMUSCULAR
  Filled 2017-03-13: qty 0.5

## 2017-03-13 NOTE — Discharge Instructions (Signed)
Take the prescriptions as directed.  Wash the sutured area to your chin gently with soap and water, and pat dry, at least twice a day, and cover with a clean/dry dressing.  Change the dressing whenever it becomes wet or soiled after washing the area with soap and water and patting dry. The "skin glue" will peel off on its' own. Apply moist heat or ice to the area(s) of discomfort, for 15 minutes at a time, several times per day for the next few days.  Do not fall asleep on a heating or ice pack.  Call your regular medical doctor on Monday to schedule a follow up appointment next week.  Return to the Emergency Department immediately if worsening.
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
## 2017-03-13 NOTE — ED Provider Notes (Signed)
I was asked to repair lacerations on forehead and left cheek. This was the only involvement in this patients care.  LACERATION REPAIR Performed by: Evalee Jefferson Authorized by: Evalee Jefferson Consent: Verbal consent obtained. Risks and benefits: risks, benefits and alternatives were discussed Consent given by: patient Patient identity confirmed: provided demographic data Prepped and Draped in normal sterile fashion Wound explored  Laceration Location: forehead  Laceration Length: 2cm, superficial  No Foreign Bodies seen or palpated  Anesthesia: n/a  Local anesthetic: none  Anesthetic total: none  Irrigation method: syringe  Amount of cleaning: standard  Skin closure: dermabond  Number of sutures: dermabond  Technique: dermabond  Patient tolerance: Patient tolerated the procedure well with no immediate complications.   LACERATION REPAIR Performed by: Evalee Jefferson Authorized by: Evalee Jefferson Consent: Verbal consent obtained. Risks and benefits: risks, benefits and alternatives were discussed Consent given by: patient Patient identity confirmed: provided demographic data Prepped and Draped in normal sterile fashion Wound explored  Laceration Location: left maxilla  Laceration Length: 1cm  No Foreign Bodies seen or palpated  Anesthesia: local infiltration  Local anesthetic: lidocaine 2% without epinephrine  Anesthetic total: 1 ml  Irrigation method: syringe Amount of cleaning: standard  Skin closure: prolene 6-0  Number of sutures: 3  Technique: simple interupted  Patient tolerance: Patient tolerated the procedure well with no immediate complications.     Evalee Jefferson, PA-C 03/13/17 [place], [place], DO 03/16/17 Einar Crow
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
## 2017-12-30 ENCOUNTER — Other Ambulatory Visit: Payer: Self-pay

## 2017-12-30 ENCOUNTER — Emergency Department (HOSPITAL_COMMUNITY): Payer: Self-pay

## 2017-12-30 ENCOUNTER — Emergency Department (HOSPITAL_COMMUNITY)
Admission: EM | Admit: 2017-12-30 | Discharge: 2017-12-30 | Disposition: A | Discharge status: Home / Self Care | Payer: Self-pay | Attending: Emergency Medicine | Admitting: Emergency Medicine

## 2017-12-30 ENCOUNTER — Encounter (HOSPITAL_COMMUNITY): Payer: Self-pay | Admitting: Emergency Medicine

## 2017-12-30 DIAGNOSIS — R42 Dizziness and giddiness: Secondary | ICD-10-CM | POA: Insufficient documentation

## 2017-12-30 DIAGNOSIS — F17210 Nicotine dependence, cigarettes, uncomplicated: Secondary | ICD-10-CM | POA: Insufficient documentation

## 2017-12-30 DIAGNOSIS — Z4802 Encounter for removal of sutures: Secondary | ICD-10-CM | POA: Insufficient documentation

## 2017-12-30 DIAGNOSIS — Z79899 Other long term (current) drug therapy: Secondary | ICD-10-CM | POA: Insufficient documentation

## 2017-12-30 LAB — RAPID URINE DRUG SCREEN, HOSP PERFORMED
Amphetamines: NOT DETECTED
Barbiturates: NOT DETECTED
Benzodiazepines: NOT DETECTED
Cocaine: NOT DETECTED
Opiates: NOT DETECTED
Tetrahydrocannabinol: NOT DETECTED

## 2017-12-30 LAB — BASIC METABOLIC PANEL
Anion gap: 10 (ref 5–15)
BUN: 13 mg/dL (ref 6–20)
CO2: 26 mmol/L (ref 22–32)
Calcium: 9.6 mg/dL (ref 8.9–10.3)
Chloride: 101 mmol/L (ref 98–111)
Creatinine, Ser: 1.06 mg/dL (ref 0.61–1.24)
GFR calc Af Amer: >60 mL/min (ref >60)
GFR calc non Af Amer: >60 mL/min (ref >60)
Glucose, Bld: 87 mg/dL (ref 70–99)
Potassium: 3.6 mmol/L (ref 3.5–5.1)
Sodium: 137 mmol/L (ref 135–145)

## 2017-12-30 LAB — CBC WITH DIFFERENTIAL/PLATELET
Basophils Absolute: 0 10*3/uL (ref 0.0–0.1)
Basophils Relative: 0 %
Eosinophils Absolute: 0.2 10*3/uL (ref 0.0–0.7)
Eosinophils Relative: 3 %
HCT: 47.7 % (ref 39.0–52.0)
Hemoglobin: 15.9 g/dL (ref 13.0–17.0)
Lymphocytes Relative: 31 %
Lymphs Abs: 2.2 10*3/uL (ref 0.7–4.0)
MCH: 27.7 pg (ref 26.0–34.0)
MCHC: 33.3 g/dL (ref 30.0–36.0)
MCV: 83.2 fL (ref 78.0–100.0)
Monocytes Absolute: 0.7 10*3/uL (ref 0.1–1.0)
Monocytes Relative: 11 %
Neutro Abs: 3.8 10*3/uL (ref 1.7–7.7)
Neutrophils Relative %: 55 %
Platelets: 226 10*3/uL (ref 150–400)
RBC: 5.73 MIL/uL (ref 4.22–5.81)
RDW: 15 % (ref 11.5–15.5)
WBC: 6.9 10*3/uL (ref 4.0–10.5)

## 2017-12-30 LAB — URINALYSIS, ROUTINE W REFLEX MICROSCOPIC
Bilirubin Urine: NEGATIVE
Glucose, UA: NEGATIVE mg/dL
Hgb urine dipstick: NEGATIVE
Ketones, ur: NEGATIVE mg/dL
Leukocytes, UA: NEGATIVE
Nitrite: NEGATIVE
Protein, ur: NEGATIVE mg/dL
Specific Gravity, Urine: 1.004 — ABNORMAL LOW (ref 1.005–1.030)
pH: 9 — ABNORMAL HIGH (ref 5.0–8.0)

## 2017-12-30 LAB — TROPONIN I: Troponin I: <0.03 ng/mL (ref <0.03)

## 2017-12-30 MED ORDER — MECLIZINE HCL 25 MG PO TABS: 25.0000 mg | ORAL_TABLET | Freq: Three times a day (TID) | ORAL | 0 refills | Status: DC | PRN

## 2017-12-30 MED ORDER — MECLIZINE HCL 12.5 MG PO TABS
25.0000 mg | ORAL_TABLET | Freq: Once | ORAL | Status: AC
  Administered 2017-12-30: 25 mg via ORAL
  Filled 2017-12-30: qty 2
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
## 2017-12-30 NOTE — Discharge Instructions (Addendum)
Take the medicine as prescribed to help relieve your symptoms.  Get rechecked if not resolved over the next week. Refer to the exercise (epley maneuvers) which can help your symptoms resolve quicker.

## 2017-12-30 NOTE — ED Triage Notes (Signed)
Patient complaining of "lightheadedness" since last night. Also complaining of bilateral ear pain x 2 days. Denies any other symptoms. Patient ambulatory at triage with no assistance or difficulty.

## 2018-05-24 ENCOUNTER — Other Ambulatory Visit: Payer: Self-pay

## 2018-05-24 ENCOUNTER — Encounter (HOSPITAL_COMMUNITY): Payer: Self-pay | Admitting: Emergency Medicine

## 2018-05-24 ENCOUNTER — Emergency Department (HOSPITAL_COMMUNITY)
Admission: EM | Admit: 2018-05-24 | Discharge: 2018-05-24 | Disposition: A | Discharge status: Home / Self Care | Payer: Medicaid Other | Attending: Emergency Medicine | Admitting: Emergency Medicine

## 2018-05-24 DIAGNOSIS — F17200 Nicotine dependence, unspecified, uncomplicated: Secondary | ICD-10-CM | POA: Insufficient documentation

## 2018-05-24 DIAGNOSIS — L309 Dermatitis, unspecified: Secondary | ICD-10-CM

## 2018-05-24 MED ORDER — TRIAMCINOLONE ACETONIDE 0.1 % EX CREA: 1.0000 "application " | TOPICAL_CREAM | Freq: Two times a day (BID) | CUTANEOUS | 1 refills | Status: DC

## 2018-05-24 MED ORDER — DEXAMETHASONE 4 MG PO TABS: 4.0000 mg | ORAL_TABLET | Freq: Two times a day (BID) | ORAL | 0 refills | Status: DC

## 2018-05-24 MED ORDER — PREDNISONE 20 MG PO TABS
40.0000 mg | ORAL_TABLET | Freq: Once | ORAL | Status: AC
  Administered 2018-05-24: 40 mg via ORAL
  Filled 2018-05-24: qty 2

## 2018-05-24 MED ORDER — LORATADINE 10 MG PO TABS
10.0000 mg | ORAL_TABLET | Freq: Once | ORAL | Status: AC
  Administered 2018-05-24: 10 mg via ORAL
  Filled 2018-05-24: qty 1

## 2018-05-24 NOTE — ED Provider Notes (Signed)
[facility] EMERGENCY DEPARTMENT Provider Note   CSN: [id number] Arrival date & time: 05/24/18  1056    History   Chief Complaint Chief Complaint  Patient presents with  . Rash    HPI Jorge Mckinney is a 23 y.o. male.     The history is provided by the patient.  Rash  Location:  Torso Torso rash location:  L chest, R chest, upper back, abd LUQ and abd RUQ Quality: itchiness and scaling   Quality: not draining and not weeping   Severity:  Moderate Onset quality:  Gradual Duration:  2 weeks Timing:  Intermittent Progression:  Worsening Chronicity:  New Context: new detergent/soap   Context: not animal contact, not food, not medications, not nuts and not plant contact   Relieved by:  Nothing Worsened by:  Nothing Associated symptoms: no abdominal pain, no joint pain, no periorbital edema, no shortness of breath, no throat swelling, no tongue swelling and not wheezing     Past Medical History:  Diagnosis Date  . Anxiety   . Depression   . Headache(784.0)   . Metacarpal bone fracture October 2012   after punching a wall, saw Dr. Lenon Curt    Patient Active Problem List   Diagnosis Date Noted  . Anxiety state, unspecified 11/08/2013  . Auditory hallucinations 11/08/2013    Past Surgical History:  Procedure Laterality Date  . NASAL SINUS SURGERY          Home Medications    Prior to Admission medications   Medication Sig Start Date End Date Taking? Authorizing Provider  acetaminophen (TYLENOL) 500 MG tablet Take 500 mg by mouth every 4 (four) hours as needed for mild pain (toothache).    [provider]  meclizine (ANTIVERT) 25 MG tablet Take 1 tablet (25 mg total) by mouth 3 (three) times daily as needed for dizziness. 12/30/17   Evalee Jefferson, PA-C    Family History Family History  Problem Relation Age of Onset  . Depression Father     Social History Social History   Tobacco Use  . Smoking status: Current Every Day Smoker    Types:  Cigarettes  . Smokeless tobacco: Never Used  Substance Use Topics  . Alcohol use: Yes    Comment: occasionally  . Drug use: Not Currently    Frequency: 2.0 times per week    Types: Marijuana, Cocaine    Comment: last use 2 years ago     Allergies   Patient has no known allergies.   Review of Systems Review of Systems  Constitutional: Negative for activity change.       All ROS Neg except as noted in HPI  HENT: Negative for nosebleeds.   Eyes: Negative for photophobia and discharge.  Respiratory: Negative for cough, shortness of breath and wheezing.   Cardiovascular: Negative for chest pain and palpitations.  Gastrointestinal: Negative for abdominal pain and blood in stool.  Genitourinary: Negative for dysuria, frequency and hematuria.  Musculoskeletal: Negative for arthralgias, back pain and neck pain.  Skin: Positive for rash.  Neurological: Negative for dizziness, seizures and speech difficulty.  Psychiatric/Behavioral: Negative for confusion and hallucinations.     Physical Exam Updated Vital Signs BP (!) 131/57 (BP Location: Right Arm)   Pulse 75   Temp 98.6 F (37 C) (Oral)   Resp 16   Ht 6' (1.829 m)   Wt 63.5 kg   SpO2 99%   BMI 18.99 kg/m   Physical Exam Vitals signs and nursing note reviewed.  Constitutional:      Appearance: He is well-developed. He is not toxic-appearing.  HENT:     Head: Normocephalic.     Right Ear: Tympanic membrane and external ear normal.     Left Ear: Tympanic membrane and external ear normal.  Eyes:     General: Lids are normal.     Pupils: Pupils are equal, round, and reactive to light.  Neck:     Musculoskeletal: Normal range of motion and neck supple.     Vascular: No carotid bruit.  Cardiovascular:     Rate and Rhythm: Normal rate and regular rhythm.     Pulses: Normal pulses.     Heart sounds: Normal heart sounds.  Pulmonary:     Effort: No respiratory distress.     Breath sounds: Normal breath sounds.    Abdominal:     General: Bowel sounds are normal.     Palpations: Abdomen is soft.     Tenderness: There is no abdominal tenderness. There is no guarding.  Musculoskeletal: Normal range of motion.  Lymphadenopathy:     Head:     Right side of head: No submandibular adenopathy.     Left side of head: No submandibular adenopathy.     Cervical: No cervical adenopathy.  Skin:    General: Skin is warm and dry.     Findings: Rash present.     Comments: Patches of dry scaling macular rash on the chest, upper abdomen, and back.  There are a few red dry areas in the left and right antecubital area.  Neurological:     Mental Status: He is alert and oriented to person, place, and time.     Cranial Nerves: No cranial nerve deficit.     Sensory: No sensory deficit.  Psychiatric:        Speech: Speech normal.      ED Treatments / Results  Labs (all labs ordered are listed, but only abnormal results are displayed) Labs Reviewed - No data to display  EKG None  Radiology No results found.  Procedures Procedures (including critical care time)  Medications Ordered in ED Medications - No data to display   Initial Impression / Assessment and Plan / ED Course  I have reviewed the triage vital signs and the nursing notes.  Pertinent labs & imaging results that were available during my care of the patient were reviewed by me and considered in my medical decision making (see chart for details).         Final Clinical Impressions(s) / ED Diagnoses MDM  Patient has had a rash on the torso area for 2 weeks.  Examination favors eczema.  The patient will be treated with triamcinolone cream, as well as a short course of a steroid.  He will use Claritin for nondrowsy antihistamine, he will use Benadryl if needed for itching.  Patient will follow-up with Ms. Register for dermatology evaluation if not improving.   Final diagnoses:  Eczema, unspecified type    ED Discharge Orders          Ordered    triamcinolone cream (KENALOG) 0.1 %  2 times daily     05/24/18 1350    dexamethasone (DECADRON) 4 MG tablet  2 times daily with meals     05/24/18 1350           Lily Kocher, PA-C 05/25/18 2150    Fredia Sorrow, MD 05/26/18 [phone]

## 2018-05-24 NOTE — Discharge Instructions (Signed)
Please use Decadron 2 times daily.  Please use Claritin daily for nondrowsy antihistamine.  Use Benadryl at bedtime if needed for itching.  Apply triamcinolone cream to rash area 2 times daily.  Please see Ms. Register for dermatology evaluation if not improving.

## 2018-05-24 NOTE — ED Triage Notes (Signed)
Patient complaining of rash to torso x 2 weeks. Unknown allergy.

## 2018-08-28 ENCOUNTER — Encounter (HOSPITAL_COMMUNITY): Admission: EM | Disposition: A | Discharge status: Home Health | Payer: Self-pay | Source: Home / Self Care

## 2018-08-28 ENCOUNTER — Emergency Department (HOSPITAL_COMMUNITY): Payer: Self-pay | Admitting: Certified Registered"

## 2018-08-28 ENCOUNTER — Emergency Department (HOSPITAL_COMMUNITY): Payer: Self-pay

## 2018-08-28 ENCOUNTER — Encounter (HOSPITAL_COMMUNITY): Payer: Self-pay

## 2018-08-28 ENCOUNTER — Inpatient Hospital Stay (HOSPITAL_COMMUNITY)
Admission: EM | Admit: 2018-08-28 | Discharge: 2018-09-01 | DRG: 957 | Disposition: A | Discharge status: Home Health | Payer: Self-pay | Attending: Orthopedic Surgery | Admitting: Orthopedic Surgery

## 2018-08-28 DIAGNOSIS — S32402B Unspecified fracture of left acetabulum, initial encounter for open fracture: Secondary | ICD-10-CM

## 2018-08-28 DIAGNOSIS — Z1159 Encounter for screening for other viral diseases: Secondary | ICD-10-CM

## 2018-08-28 DIAGNOSIS — S32452A Displaced transverse fracture of left acetabulum, initial encounter for closed fracture: Secondary | ICD-10-CM | POA: Diagnosis present

## 2018-08-28 DIAGNOSIS — S7402XA Injury of sciatic nerve at hip and thigh level, left leg, initial encounter: Secondary | ICD-10-CM | POA: Diagnosis present

## 2018-08-28 DIAGNOSIS — S32402A Unspecified fracture of left acetabulum, initial encounter for closed fracture: Secondary | ICD-10-CM

## 2018-08-28 DIAGNOSIS — W3400XA Accidental discharge from unspecified firearms or gun, initial encounter: Secondary | ICD-10-CM

## 2018-08-28 DIAGNOSIS — S31644A Puncture wound with foreign body of abdominal wall, left lower quadrant with penetration into peritoneal cavity, initial encounter: Principal | ICD-10-CM | POA: Diagnosis present

## 2018-08-28 DIAGNOSIS — S301XXA Contusion of abdominal wall, initial encounter: Secondary | ICD-10-CM | POA: Diagnosis present

## 2018-08-28 DIAGNOSIS — S81802A Unspecified open wound, left lower leg, initial encounter: Secondary | ICD-10-CM

## 2018-08-28 DIAGNOSIS — S32435A Nondisplaced fracture of anterior column [iliopubic] of left acetabulum, initial encounter for closed fracture: Secondary | ICD-10-CM | POA: Diagnosis present

## 2018-08-28 DIAGNOSIS — K661 Hemoperitoneum: Secondary | ICD-10-CM | POA: Diagnosis present

## 2018-08-28 DIAGNOSIS — F17210 Nicotine dependence, cigarettes, uncomplicated: Secondary | ICD-10-CM | POA: Diagnosis present

## 2018-08-28 DIAGNOSIS — F1210 Cannabis abuse, uncomplicated: Secondary | ICD-10-CM | POA: Diagnosis present

## 2018-08-28 DIAGNOSIS — S81032A Puncture wound without foreign body, left knee, initial encounter: Secondary | ICD-10-CM | POA: Diagnosis present

## 2018-08-28 DIAGNOSIS — S31030A Puncture wound without foreign body of lower back and pelvis without penetration into retroperitoneum, initial encounter: Secondary | ICD-10-CM

## 2018-08-28 DIAGNOSIS — D62 Acute posthemorrhagic anemia: Secondary | ICD-10-CM | POA: Diagnosis present

## 2018-08-28 HISTORY — PX: LAPAROTOMY: SHX154

## 2018-08-28 HISTORY — PX: INCISION AND DRAINAGE OF WOUND: SHX1803

## 2018-08-28 LAB — I-STAT CHEM 8, ED
BUN: 11 mg/dL (ref 6–20)
Calcium, Ion: 1.06 mmol/L — ABNORMAL LOW (ref 1.15–1.40)
Chloride: 106 mmol/L (ref 98–111)
Creatinine, Ser: 1.2 mg/dL (ref 0.61–1.24)
Glucose, Bld: 103 mg/dL — ABNORMAL HIGH (ref 70–99)
HCT: 48 % (ref 39.0–52.0)
Hemoglobin: 16.3 g/dL (ref 13.0–17.0)
Potassium: 3.5 mmol/L (ref 3.5–5.1)
Sodium: 140 mmol/L (ref 135–145)
TCO2: 22 mmol/L (ref 22–32)

## 2018-08-28 LAB — TYPE AND SCREEN
ABO/RH(D): O POS
Antibody Screen: NEGATIVE
Unit division: 0
Unit division: 0

## 2018-08-28 LAB — BPAM RBC
Blood Product Expiration Date: 202006242359
Blood Product Expiration Date: 202006242359
ISSUE DATE / TIME: 202005301230
ISSUE DATE / TIME: 202005301230
Unit Type and Rh: 5100
Unit Type and Rh: 5100

## 2018-08-28 LAB — PROTIME-INR
INR: 1.1 (ref 0.8–1.2)
Prothrombin Time: 14.3 seconds (ref 11.4–15.2)

## 2018-08-28 LAB — BPAM FFP
Blood Product Expiration Date: 202006032359
Blood Product Expiration Date: 202006032359
ISSUE DATE / TIME: 202005301230
ISSUE DATE / TIME: 202005301230
Unit Type and Rh: 6200
Unit Type and Rh: 6200

## 2018-08-28 LAB — URINALYSIS, ROUTINE W REFLEX MICROSCOPIC
Bilirubin Urine: NEGATIVE
Glucose, UA: NEGATIVE mg/dL
Hgb urine dipstick: NEGATIVE
Ketones, ur: NEGATIVE mg/dL
Leukocytes,Ua: NEGATIVE
Nitrite: NEGATIVE
Protein, ur: NEGATIVE mg/dL
Specific Gravity, Urine: >1.046 — ABNORMAL HIGH (ref 1.005–1.030)
pH: 7 (ref 5.0–8.0)

## 2018-08-28 LAB — ABO/RH: ABO/RH(D): O POS

## 2018-08-28 LAB — CBC
HCT: 44.8 % (ref 39.0–52.0)
Hemoglobin: 14.5 g/dL (ref 13.0–17.0)
MCH: 27 pg (ref 26.0–34.0)
MCHC: 32.4 g/dL (ref 30.0–36.0)
MCV: 83.3 fL (ref 80.0–100.0)
Platelets: 219 10*3/uL (ref 150–400)
RBC: 5.38 MIL/uL (ref 4.22–5.81)
RDW: 16.1 % — ABNORMAL HIGH (ref 11.5–15.5)
WBC: 12.8 10*3/uL — ABNORMAL HIGH (ref 4.0–10.5)
nRBC: 0 % (ref 0.0–0.2)

## 2018-08-28 LAB — COMPREHENSIVE METABOLIC PANEL
ALT: 22 U/L (ref 0–44)
AST: 29 U/L (ref 15–41)
Albumin: 3.9 g/dL (ref 3.5–5.0)
Alkaline Phosphatase: 61 U/L (ref 38–126)
Anion gap: 11 (ref 5–15)
BUN: 9 mg/dL (ref 6–20)
CO2: 21 mmol/L — ABNORMAL LOW (ref 22–32)
Calcium: 9.3 mg/dL (ref 8.9–10.3)
Chloride: 107 mmol/L (ref 98–111)
Creatinine, Ser: 1.27 mg/dL — ABNORMAL HIGH (ref 0.61–1.24)
GFR calc Af Amer: >60 mL/min (ref >60)
GFR calc non Af Amer: >60 mL/min (ref >60)
Glucose, Bld: 110 mg/dL — ABNORMAL HIGH (ref 70–99)
Potassium: 3.5 mmol/L (ref 3.5–5.1)
Sodium: 139 mmol/L (ref 135–145)
Total Bilirubin: 0.7 mg/dL (ref 0.3–1.2)
Total Protein: 7.3 g/dL (ref 6.5–8.1)

## 2018-08-28 LAB — PREPARE FRESH FROZEN PLASMA
Unit division: 0
Unit division: 0

## 2018-08-28 LAB — ETHANOL: Alcohol, Ethyl (B): <10 mg/dL (ref <10)

## 2018-08-28 LAB — LACTIC ACID, PLASMA: Lactic Acid, Venous: 2.8 mmol/L (ref 0.5–1.9)

## 2018-08-28 LAB — SARS CORONAVIRUS 2 BY RT PCR (HOSPITAL ORDER, PERFORMED IN ~~LOC~~ HOSPITAL LAB): SARS Coronavirus 2: NEGATIVE

## 2018-08-28 SURGERY — LAPAROTOMY, EXPLORATORY
Anesthesia: General | Site: Leg Upper

## 2018-08-28 MED ORDER — CEFAZOLIN SODIUM-DEXTROSE 1-4 GM/50ML-% IV SOLN
1.0000 g | Freq: Three times a day (TID) | INTRAVENOUS | Status: AC
  Administered 2018-08-29 (×2): 1 g via INTRAVENOUS
  Filled 2018-08-28 (×2): qty 50

## 2018-08-28 MED ORDER — CEFAZOLIN SODIUM-DEXTROSE 1-4 GM/50ML-% IV SOLN
INTRAVENOUS | Status: DC | PRN
  Administered 2018-08-28: 1 g via INTRAVENOUS

## 2018-08-28 MED ORDER — LACTATED RINGERS IV SOLN
INTRAVENOUS | Status: DC
  Administered 2018-08-28: 16:00:00 via INTRAVENOUS

## 2018-08-28 MED ORDER — METHYLENE BLUE 0.5 % INJ SOLN
INTRAVENOUS | Status: AC
  Filled 2018-08-28: qty 10

## 2018-08-28 MED ORDER — CEFAZOLIN SODIUM-DEXTROSE 1-4 GM/50ML-% IV SOLN
1.0000 g | Freq: Once | INTRAVENOUS | Status: AC
  Administered 2018-08-28: 14:00:00 1 g via INTRAVENOUS
  Filled 2018-08-28: qty 50

## 2018-08-28 MED ORDER — IOHEXOL 300 MG/ML  SOLN
100.0000 mL | Freq: Once | INTRAMUSCULAR | Status: AC | PRN
  Administered 2018-08-28: 100 mL via INTRAVENOUS

## 2018-08-28 MED ORDER — HYDROMORPHONE HCL 1 MG/ML IJ SOLN
1.0000 mg | Freq: Once | INTRAMUSCULAR | Status: AC
  Administered 2018-08-28: 14:00:00 1 mg via INTRAVENOUS
  Filled 2018-08-28: qty 1

## 2018-08-28 MED ORDER — LIDOCAINE 2% (20 MG/ML) 5 ML SYRINGE
INTRAMUSCULAR | Status: AC
  Filled 2018-08-28: qty 5

## 2018-08-28 MED ORDER — SUGAMMADEX SODIUM 200 MG/2ML IV SOLN
INTRAVENOUS | Status: DC | PRN
  Administered 2018-08-28: 200 mg via INTRAVENOUS

## 2018-08-28 MED ORDER — ONDANSETRON 4 MG PO TBDP: 4.0000 mg | ORAL_TABLET | Freq: Four times a day (QID) | ORAL | Status: DC | PRN

## 2018-08-28 MED ORDER — PROPOFOL 10 MG/ML IV BOLUS
INTRAVENOUS | Status: DC | PRN
  Administered 2018-08-28: 200 mg via INTRAVENOUS

## 2018-08-28 MED ORDER — FENTANYL CITRATE (PF) 100 MCG/2ML IJ SOLN
INTRAMUSCULAR | Status: DC | PRN
  Administered 2018-08-28: 100 ug via INTRAVENOUS
  Administered 2018-08-28: 50 ug via INTRAVENOUS
  Administered 2018-08-28: 100 ug via INTRAVENOUS

## 2018-08-28 MED ORDER — ALUM & MAG HYDROXIDE-SIMETH 200-200-20 MG/5ML PO SUSP: 30.0000 mL | Freq: Four times a day (QID) | ORAL | Status: DC | PRN

## 2018-08-28 MED ORDER — KCL IN DEXTROSE-NACL 20-5-0.45 MEQ/L-%-% IV SOLN
INTRAVENOUS | Status: DC
  Administered 2018-08-28 – 2018-08-30 (×3): via INTRAVENOUS
  Filled 2018-08-28 (×4): qty 1000

## 2018-08-28 MED ORDER — LACTATED RINGERS IV SOLN
INTRAVENOUS | Status: DC | PRN
  Administered 2018-08-28: 16:00:00 via INTRAVENOUS

## 2018-08-28 MED ORDER — PROMETHAZINE HCL 25 MG/ML IJ SOLN: 6.2500 mg | INTRAMUSCULAR | Status: DC | PRN

## 2018-08-28 MED ORDER — TETANUS-DIPHTH-ACELL PERTUSSIS 5-2.5-18.5 LF-MCG/0.5 IM SUSP
0.5000 mL | Freq: Once | INTRAMUSCULAR | Status: AC
  Administered 2018-08-28: 0.5 mL via INTRAMUSCULAR

## 2018-08-28 MED ORDER — ONDANSETRON HCL 4 MG/2ML IJ SOLN: 4.0000 mg | Freq: Four times a day (QID) | INTRAMUSCULAR | Status: DC | PRN

## 2018-08-28 MED ORDER — PROPOFOL 10 MG/ML IV BOLUS
INTRAVENOUS | Status: AC
  Filled 2018-08-28: qty 20

## 2018-08-28 MED ORDER — OXYCODONE HCL 5 MG PO TABS
5.0000 mg | ORAL_TABLET | ORAL | Status: DC | PRN
  Administered 2018-08-28: 22:00:00 5 mg via ORAL
  Administered 2018-08-29 (×2): 10 mg via ORAL
  Administered 2018-08-30 – 2018-08-31 (×7): 5 mg via ORAL
  Administered 2018-09-01 (×2): 10 mg via ORAL
  Filled 2018-08-28: qty 1
  Filled 2018-08-28: qty 2
  Filled 2018-08-28: qty 1
  Filled 2018-08-28 (×2): qty 2
  Filled 2018-08-28 (×6): qty 1
  Filled 2018-08-28: qty 2

## 2018-08-28 MED ORDER — DEXAMETHASONE SODIUM PHOSPHATE 10 MG/ML IJ SOLN
INTRAMUSCULAR | Status: DC | PRN
  Administered 2018-08-28: 5 mg via INTRAVENOUS

## 2018-08-28 MED ORDER — 0.9 % SODIUM CHLORIDE (POUR BTL) OPTIME
TOPICAL | Status: DC | PRN
  Administered 2018-08-28: 3000 mL

## 2018-08-28 MED ORDER — HYDROMORPHONE HCL 1 MG/ML IJ SOLN
0.5000 mg | INTRAMUSCULAR | Status: DC | PRN
  Administered 2018-08-29 – 2018-08-30 (×2): 1 mg via INTRAVENOUS
  Filled 2018-08-28 (×2): qty 1

## 2018-08-28 MED ORDER — KETOROLAC TROMETHAMINE 30 MG/ML IJ SOLN
30.0000 mg | Freq: Four times a day (QID) | INTRAMUSCULAR | Status: AC
  Administered 2018-08-29 (×4): 30 mg via INTRAVENOUS
  Filled 2018-08-28 (×4): qty 1

## 2018-08-28 MED ORDER — FENTANYL CITRATE (PF) 250 MCG/5ML IJ SOLN
INTRAMUSCULAR | Status: AC
  Filled 2018-08-28: qty 5

## 2018-08-28 MED ORDER — ACETAMINOPHEN 325 MG PO TABS
650.0000 mg | ORAL_TABLET | ORAL | Status: DC | PRN
  Administered 2018-08-31: 650 mg via ORAL
  Filled 2018-08-28: qty 2

## 2018-08-28 MED ORDER — HYDROMORPHONE HCL 1 MG/ML IJ SOLN
INTRAMUSCULAR | Status: AC
  Filled 2018-08-28: qty 1

## 2018-08-28 MED ORDER — ROCURONIUM BROMIDE 10 MG/ML (PF) SYRINGE
PREFILLED_SYRINGE | INTRAVENOUS | Status: DC | PRN
  Administered 2018-08-28: 40 mg via INTRAVENOUS

## 2018-08-28 MED ORDER — SENNOSIDES-DOCUSATE SODIUM 8.6-50 MG PO TABS
1.0000 | ORAL_TABLET | Freq: Two times a day (BID) | ORAL | Status: DC
  Administered 2018-08-28 – 2018-09-01 (×8): 1 via ORAL
  Filled 2018-08-28 (×8): qty 1

## 2018-08-28 MED ORDER — HYDROMORPHONE HCL 1 MG/ML IJ SOLN
0.2500 mg | INTRAMUSCULAR | Status: DC | PRN
  Administered 2018-08-28 (×2): 0.5 mg via INTRAVENOUS

## 2018-08-28 MED ORDER — ENOXAPARIN SODIUM 40 MG/0.4ML ~~LOC~~ SOLN
40.0000 mg | SUBCUTANEOUS | Status: DC
  Administered 2018-08-29 – 2018-09-01 (×4): 40 mg via SUBCUTANEOUS
  Filled 2018-08-28 (×4): qty 0.4

## 2018-08-28 MED ORDER — MEPERIDINE HCL 25 MG/ML IJ SOLN: 6.2500 mg | INTRAMUSCULAR | Status: DC | PRN

## 2018-08-28 MED ORDER — CEFAZOLIN SODIUM 1 G IJ SOLR
INTRAMUSCULAR | Status: AC
  Filled 2018-08-28: qty 10

## 2018-08-28 MED ORDER — SUCCINYLCHOLINE CHLORIDE 200 MG/10ML IV SOSY
PREFILLED_SYRINGE | INTRAVENOUS | Status: AC
  Filled 2018-08-28: qty 10

## 2018-08-28 MED ORDER — ONDANSETRON HCL 4 MG/2ML IJ SOLN
INTRAMUSCULAR | Status: DC | PRN
  Administered 2018-08-28: 4 mg via INTRAVENOUS

## 2018-08-28 MED ORDER — IBUPROFEN 800 MG PO TABS
800.0000 mg | ORAL_TABLET | Freq: Four times a day (QID) | ORAL | Status: DC | PRN
  Administered 2018-08-30: 800 mg via ORAL
  Filled 2018-08-28: qty 1

## 2018-08-28 MED ORDER — FENTANYL CITRATE (PF) 100 MCG/2ML IJ SOLN
50.0000 ug | Freq: Once | INTRAMUSCULAR | Status: AC
  Administered 2018-08-28: 12:00:00 50 ug via INTRAVENOUS

## 2018-08-28 MED ORDER — FENTANYL CITRATE (PF) 100 MCG/2ML IJ SOLN: 50.0000 ug | INTRAMUSCULAR | Status: DC | PRN

## 2018-08-28 MED ORDER — DEXAMETHASONE SODIUM PHOSPHATE 10 MG/ML IJ SOLN
INTRAMUSCULAR | Status: AC
  Filled 2018-08-28: qty 1

## 2018-08-28 MED ORDER — SUCCINYLCHOLINE CHLORIDE 200 MG/10ML IV SOSY
PREFILLED_SYRINGE | INTRAVENOUS | Status: DC | PRN
  Administered 2018-08-28: 140 mg via INTRAVENOUS

## 2018-08-28 MED ORDER — LIDOCAINE 2% (20 MG/ML) 5 ML SYRINGE
INTRAMUSCULAR | Status: DC | PRN
  Administered 2018-08-28: 100 mg via INTRAVENOUS

## 2018-08-28 MED ORDER — DIPHENHYDRAMINE HCL 50 MG/ML IJ SOLN: 12.5000 mg | Freq: Four times a day (QID) | INTRAMUSCULAR | Status: DC | PRN

## 2018-08-28 MED ORDER — ROCURONIUM BROMIDE 10 MG/ML (PF) SYRINGE
PREFILLED_SYRINGE | INTRAVENOUS | Status: AC
  Filled 2018-08-28: qty 10

## 2018-08-28 MED ORDER — MIDAZOLAM HCL 5 MG/5ML IJ SOLN
INTRAMUSCULAR | Status: DC | PRN
  Administered 2018-08-28: 2 mg via INTRAVENOUS

## 2018-08-28 MED ORDER — MIDAZOLAM HCL 2 MG/2ML IJ SOLN
INTRAMUSCULAR | Status: AC
  Filled 2018-08-28: qty 2

## 2018-08-28 MED ORDER — ONDANSETRON HCL 4 MG/2ML IJ SOLN
INTRAMUSCULAR | Status: AC
  Filled 2018-08-28: qty 2

## 2018-08-28 SURGICAL SUPPLY — 48 items
BLADE CLIPPER SURG (BLADE) ×2 IMPLANT
BNDG CMPR MED 10X6 ELC LF (GAUZE/BANDAGES/DRESSINGS) ×2
BNDG ELASTIC 6X10 VLCR STRL LF (GAUZE/BANDAGES/DRESSINGS) ×2 IMPLANT
CANISTER SUCT 3000ML PPV (MISCELLANEOUS) ×2 IMPLANT
CHLORAPREP W/TINT 26ML (MISCELLANEOUS) ×4 IMPLANT
COVER SURGICAL LIGHT HANDLE (MISCELLANEOUS) ×4 IMPLANT
COVER WAND RF STERILE (DRAPES) ×4 IMPLANT
DRAPE LAPAROSCOPIC ABDOMINAL (DRAPES) ×4 IMPLANT
DRAPE WARM FLUID 44X44 (DRAPE) ×4 IMPLANT
DRSG OPSITE 11X17.75 LRG (GAUZE/BANDAGES/DRESSINGS) ×2 IMPLANT
DRSG OPSITE POSTOP 4X10 (GAUZE/BANDAGES/DRESSINGS) IMPLANT
DRSG OPSITE POSTOP 4X8 (GAUZE/BANDAGES/DRESSINGS) IMPLANT
DRSG XEROFORM 1X8 (GAUZE/BANDAGES/DRESSINGS) ×2 IMPLANT
ELECT BLADE 6.5 EXT (BLADE) ×2 IMPLANT
ELECT CAUTERY BLADE 6.4 (BLADE) ×4 IMPLANT
ELECT REM PT RETURN 9FT ADLT (ELECTROSURGICAL) ×4
ELECTRODE REM PT RTRN 9FT ADLT (ELECTROSURGICAL) ×2 IMPLANT
GAUZE SPONGE 4X4 12PLY STRL LF (GAUZE/BANDAGES/DRESSINGS) ×2 IMPLANT
GLOVE BIO SURGEON STRL SZ 6 (GLOVE) ×4 IMPLANT
GLOVE INDICATOR 6.5 STRL GRN (GLOVE) ×4 IMPLANT
GOWN STRL REUS W/ TWL LRG LVL3 (GOWN DISPOSABLE) ×2 IMPLANT
GOWN STRL REUS W/TWL 2XL LVL3 (GOWN DISPOSABLE) ×4 IMPLANT
GOWN STRL REUS W/TWL LRG LVL3 (GOWN DISPOSABLE) ×4
KIT BASIN OR (CUSTOM PROCEDURE TRAY) ×4 IMPLANT
KIT TURNOVER KIT B (KITS) ×4 IMPLANT
LIGASURE IMPACT 36 18CM CVD LR (INSTRUMENTS) IMPLANT
NS IRRIG 1000ML POUR BTL (IV SOLUTION) ×10 IMPLANT
PACK GENERAL/GYN (CUSTOM PROCEDURE TRAY) ×4 IMPLANT
PAD ARMBOARD 7.5X6 YLW CONV (MISCELLANEOUS) ×6 IMPLANT
PENCIL SMOKE EVACUATOR (MISCELLANEOUS) ×2 IMPLANT
SPECIMEN JAR LARGE (MISCELLANEOUS) IMPLANT
SPONGE LAP 18X18 RF (DISPOSABLE) ×4 IMPLANT
STAPLER VISISTAT 35W (STAPLE) ×4 IMPLANT
SUCTION POOLE TIP (SUCTIONS) ×4 IMPLANT
SUT PDS II 0 TP-1 LOOPED 60 (SUTURE) ×8 IMPLANT
SUT SILK 2 0 SH CR/8 (SUTURE) ×2 IMPLANT
SUT SILK 2 0 TIES 10X30 (SUTURE) ×2 IMPLANT
SUT SILK 3 0 SH CR/8 (SUTURE) ×2 IMPLANT
SUT SILK 3 0 TIES 10X30 (SUTURE) ×2 IMPLANT
SUT VIC AB 2-0 SH 18 (SUTURE) ×2 IMPLANT
SUT VIC AB 3-0 SH 18 (SUTURE) ×2 IMPLANT
SUT VICRYL 4-0 PS2 18IN ABS (SUTURE) IMPLANT
SUT VICRYL AB 2 0 TIES (SUTURE) ×2 IMPLANT
SUT VICRYL AB 3 0 TIES (SUTURE) ×2 IMPLANT
TOWEL OR 17X24 6PK STRL BLUE (TOWEL DISPOSABLE) ×2 IMPLANT
TOWEL OR 17X26 10 PK STRL BLUE (TOWEL DISPOSABLE) ×4 IMPLANT
TRAY FOLEY MTR SLVR 16FR STAT (SET/KITS/TRAYS/PACK) ×2 IMPLANT
YANKAUER SUCT BULB TIP NO VENT (SUCTIONS) ×2 IMPLANT

## 2018-08-28 NOTE — Anesthesia Procedure Notes (Signed)
Procedure Name: Intubation Date/Time: 08/28/2018 4:43 PM Performed by: Moshe Salisbury, CRNA Pre-anesthesia Checklist: Patient identified, Emergency Drugs available, Suction available and Patient being monitored Patient Re-evaluated:Patient Re-evaluated prior to induction Oxygen Delivery Method: Circle System Utilized Preoxygenation: Pre-oxygenation with 100% oxygen Induction Type: IV induction and Rapid sequence Laryngoscope Size: Mac and 4 Grade View: Grade I Tube type: Oral Tube size: 8.0 mm Number of attempts: 1 Airway Equipment and Method: Stylet Placement Confirmation: ETT inserted through vocal cords under direct vision,  positive ETCO2 and breath sounds checked- equal and bilateral Secured at: 22 cm Tube secured with: Tape Dental Injury: Teeth and Oropharynx as per pre-operative assessment

## 2018-08-28 NOTE — Anesthesia Preprocedure Evaluation (Addendum)
Anesthesia Evaluation  Patient identified by MRN, date of birth, ID band Patient awake    Reviewed: Allergy & Precautions, NPO status , Patient's Chart, lab work & pertinent test results  Airway Mallampati: I  TM Distance: >3 FB Neck ROM: Full    Dental  (+) Dental Advisory Given, Teeth Intact   Pulmonary neg pulmonary ROS, Current Smoker,    Pulmonary exam normal breath sounds clear to auscultation       Cardiovascular negative cardio ROS Normal cardiovascular exam Rhythm:Regular Rate:Normal     Neuro/Psych negative neurological ROS  negative psych ROS   GI/Hepatic negative GI ROS, Neg liver ROS,   Endo/Other  negative endocrine ROS  Renal/GU negative Renal ROS     Musculoskeletal negative musculoskeletal ROS (+)   Abdominal   Peds  Hematology negative hematology ROS (+)   Anesthesia Other Findings   Reproductive/Obstetrics                            Anesthesia Physical Anesthesia Plan  ASA: II and emergent  Anesthesia Plan: General   Post-op Pain Management:    Induction: Intravenous  PONV Risk Score and Plan: 3 and Ondansetron, Dexamethasone, Midazolam and Treatment may vary due to age or medical condition  Airway Management Planned: Oral ETT  Additional Equipment: None  Intra-op Plan:   Post-operative Plan: Extubation in OR  Informed Consent: I have reviewed the patients History and Physical, chart, labs and discussed the procedure including the risks, benefits and alternatives for the proposed anesthesia with the patient or authorized representative who has indicated his/her understanding and acceptance.     Dental advisory given  Plan Discussed with: CRNA, Anesthesiologist and Surgeon  Anesthesia Plan Comments:        Anesthesia Quick Evaluation
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
## 2018-08-28 NOTE — ED Notes (Signed)
Dressing to buttocks has been changed/saturated X 2 now, MD aware.
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
## 2018-08-28 NOTE — Progress Notes (Signed)
Patient transferred from PACU, drowsy but easily arousable. Honeycomb dressing on the abdomen dry and intact. Left knee compression dressing in situ. Left buttocks dressing in situ.Foley catheter in situ draining well.
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
## 2018-08-28 NOTE — ED Notes (Signed)
Pt mother has been updated again.
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
## 2018-08-28 NOTE — ED Notes (Signed)
Patient transported to CT 
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
## 2018-08-28 NOTE — ED Notes (Signed)
Pt mother updated w pt permission.
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
## 2018-08-28 NOTE — ED Notes (Signed)
OR RN       [phone]

## 2018-08-28 NOTE — Progress Notes (Signed)
This chaplain read chat message from Uvaldo Rising.  Chaplain chose to delay return phone call request to Pt. mother Raford Pitcher at [phone] because of confidential patient status.  Pt., at time of visit, did not share the mother's name with the chaplain.

## 2018-08-29 ENCOUNTER — Encounter (HOSPITAL_COMMUNITY): Payer: Self-pay | Admitting: General Surgery

## 2018-08-29 LAB — RAPID URINE DRUG SCREEN, HOSP PERFORMED
Amphetamines: NOT DETECTED
Barbiturates: NOT DETECTED
Benzodiazepines: POSITIVE — AB
Cocaine: NOT DETECTED
Opiates: POSITIVE — AB
Tetrahydrocannabinol: POSITIVE — AB

## 2018-08-29 LAB — BASIC METABOLIC PANEL
Anion gap: 7 (ref 5–15)
BUN: 6 mg/dL (ref 6–20)
CO2: 24 mmol/L (ref 22–32)
Calcium: 8.7 mg/dL — ABNORMAL LOW (ref 8.9–10.3)
Chloride: 103 mmol/L (ref 98–111)
Creatinine, Ser: 1.01 mg/dL (ref 0.61–1.24)
GFR calc Af Amer: >60 mL/min (ref >60)
GFR calc non Af Amer: >60 mL/min (ref >60)
Glucose, Bld: 126 mg/dL — ABNORMAL HIGH (ref 70–99)
Potassium: 4.2 mmol/L (ref 3.5–5.1)
Sodium: 134 mmol/L — ABNORMAL LOW (ref 135–145)

## 2018-08-29 LAB — URINALYSIS, COMPLETE (UACMP) WITH MICROSCOPIC
Bacteria, UA: NONE SEEN
Bilirubin Urine: NEGATIVE
Glucose, UA: NEGATIVE mg/dL
Hgb urine dipstick: NEGATIVE
Ketones, ur: 20 mg/dL — AB
Leukocytes,Ua: NEGATIVE
Nitrite: NEGATIVE
Protein, ur: NEGATIVE mg/dL
Specific Gravity, Urine: 1.033 — ABNORMAL HIGH (ref 1.005–1.030)
pH: 6 (ref 5.0–8.0)

## 2018-08-29 LAB — CBC
HCT: 41.5 % (ref 39.0–52.0)
Hemoglobin: 13.7 g/dL (ref 13.0–17.0)
MCH: 26.8 pg (ref 26.0–34.0)
MCHC: 33 g/dL (ref 30.0–36.0)
MCV: 81.2 fL (ref 80.0–100.0)
Platelets: 181 10*3/uL (ref 150–400)
RBC: 5.11 MIL/uL (ref 4.22–5.81)
RDW: 15.5 % (ref 11.5–15.5)
WBC: 14.8 10*3/uL — ABNORMAL HIGH (ref 4.0–10.5)
nRBC: 0 % (ref 0.0–0.2)

## 2018-08-29 LAB — HIV ANTIBODY (ROUTINE TESTING W REFLEX): HIV Screen 4th Generation wRfx: NONREACTIVE

## 2018-08-29 LAB — CDS SEROLOGY

## 2018-08-29 NOTE — Plan of Care (Signed)
  Problem: Education: Goal: Knowledge of General Education information will improve Description: Including pain rating scale, medication(s)/side effects and non-pharmacologic comfort measures Outcome: Progressing   Problem: Clinical Measurements: Goal: Will remain free from infection Outcome: Progressing Goal: Cardiovascular complication will be avoided Outcome: Progressing   Problem: Nutrition: Goal: Adequate nutrition will be maintained Outcome: Progressing   
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
## 2018-08-30 ENCOUNTER — Encounter (HOSPITAL_COMMUNITY): Payer: Self-pay | Admitting: Orthopedic Surgery

## 2018-08-30 ENCOUNTER — Inpatient Hospital Stay (HOSPITAL_COMMUNITY): Payer: Self-pay

## 2018-08-30 DIAGNOSIS — S32452A Displaced transverse fracture of left acetabulum, initial encounter for closed fracture: Secondary | ICD-10-CM

## 2018-08-30 DIAGNOSIS — S7402XA Injury of sciatic nerve at hip and thigh level, left leg, initial encounter: Secondary | ICD-10-CM

## 2018-08-30 HISTORY — DX: Injury of sciatic nerve at hip and thigh level, left leg, initial encounter: S74.02XA

## 2018-08-30 HISTORY — DX: Displaced transverse fracture of left acetabulum, initial encounter for closed fracture: S32.452A

## 2018-08-30 LAB — BLOOD PRODUCT ORDER (VERBAL) VERIFICATION
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
## 2018-08-31 ENCOUNTER — Other Ambulatory Visit: Payer: Self-pay

## 2018-08-31 LAB — CBC
HCT: 36.4 % — ABNORMAL LOW (ref 39.0–52.0)
Hemoglobin: 11.8 g/dL — ABNORMAL LOW (ref 13.0–17.0)
MCH: 26.6 pg (ref 26.0–34.0)
MCHC: 32.4 g/dL (ref 30.0–36.0)
MCV: 82 fL (ref 80.0–100.0)
Platelets: 158 10*3/uL (ref 150–400)
RBC: 4.44 MIL/uL (ref 4.22–5.81)
RDW: 15.4 % (ref 11.5–15.5)
WBC: 9.2 10*3/uL (ref 4.0–10.5)
nRBC: 0 % (ref 0.0–0.2)

## 2018-08-31 MED ORDER — HYDROMORPHONE HCL 1 MG/ML IJ SOLN: 0.5000 mg | INTRAMUSCULAR | Status: DC | PRN

## 2018-08-31 MED ORDER — ACETAMINOPHEN 500 MG PO TABS
1000.0000 mg | ORAL_TABLET | Freq: Four times a day (QID) | ORAL | Status: DC
  Administered 2018-08-31 – 2018-09-01 (×4): 1000 mg via ORAL
  Filled 2018-08-31 (×4): qty 2

## 2018-08-31 MED ORDER — METHOCARBAMOL 500 MG PO TABS: 500.0000 mg | ORAL_TABLET | Freq: Three times a day (TID) | ORAL | Status: DC | PRN
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
## 2018-08-31 NOTE — Plan of Care (Signed)

## 2018-08-31 NOTE — Plan of Care (Signed)
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
## 2018-09-01 ENCOUNTER — Encounter (HOSPITAL_COMMUNITY): Payer: Self-pay | Admitting: Emergency Medicine

## 2018-09-01 MED ORDER — ACETAMINOPHEN 500 MG PO TABS: 1000.0000 mg | ORAL_TABLET | Freq: Four times a day (QID) | ORAL | 0 refills | Status: DC

## 2018-09-01 MED ORDER — ASPIRIN EC 325 MG PO TBEC: 325.0000 mg | DELAYED_RELEASE_TABLET | Freq: Every day | ORAL | 3 refills | Status: AC

## 2018-09-01 MED ORDER — OXYCODONE HCL 5 MG PO TABS: 5.0000 mg | ORAL_TABLET | ORAL | 0 refills | Status: DC | PRN

## 2018-09-01 MED ORDER — IBUPROFEN 800 MG PO TABS: 800.0000 mg | ORAL_TABLET | Freq: Three times a day (TID) | ORAL | 0 refills | Status: DC | PRN

## 2018-09-01 MED ORDER — METHOCARBAMOL 500 MG PO TABS: 500.0000 mg | ORAL_TABLET | Freq: Three times a day (TID) | ORAL | 0 refills | Status: DC | PRN

## 2018-09-01 MED FILL — oxyCODONE HCL 5 MG TABS: 5 | 7 days supply | Qty: 30 | Fill #0

## 2018-09-01 MED FILL — METHOCARBAMOL 500 MG TABLET: 500 | 7 days supply | Qty: 20 | Fill #0
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
## 2018-09-01 NOTE — Discharge Instructions (Signed)
Touch down weight bearing to left leg only Unrestricted range of motion left hip and knee  Daily dressing changes to L knee  [facility]  Please arrive at least 30 min before your appointment to complete your check in paperwork.  If you are unable to arrive 30 min prior to your appointment time we may have to cancel or reschedule you. LAPAROSCOPIC SURGERY: POST OP INSTRUCTIONS Always review your discharge instruction sheet given to you by the facility where your surgery was performed. IF YOU HAVE DISABILITY OR FAMILY LEAVE FORMS, YOU MUST BRING THEM TO THE OFFICE FOR PROCESSING.   DO NOT GIVE THEM TO YOUR DOCTOR.  PAIN CONTROL  1. First take acetaminophen (Tylenol) AND/or ibuprofen (Advil) to control your pain after surgery.  Follow directions on package.  Taking acetaminophen (Tylenol) and/or ibuprofen (Advil) regularly after surgery will help to control your pain and lower the amount of prescription pain medication you may need.  You should not take more than 4,000 mg (4 grams) of acetaminophen (Tylenol) in 24 hours.  You should not take ibuprofen (Advil), aleve, motrin, naprosyn or other NSAIDS if you have a history of stomach ulcers or chronic kidney disease.  2. A prescription for pain medication may be given to you upon discharge.  Take your pain medication as prescribed, if you still have uncontrolled pain after taking acetaminophen (Tylenol) or ibuprofen (Advil). 3. Use ice packs to help control pain. 4. If you need a refill on your pain medication, please contact your pharmacy.  They will contact our office to request authorization. Prescriptions will not be filled after 5pm or on week-ends.  HOME MEDICATIONS 5. Take your usually prescribed medications unless otherwise directed.  DIET 6. You should follow a light diet the first few days after arrival home.  Be sure to include lots of fluids daily. Avoid fatty, fried foods.   CONSTIPATION 7. It is common to  experience some constipation after surgery and if you are taking pain medication.  Increasing fluid intake and taking a stool softener (such as Colace) will usually help or prevent this problem from occurring.  A mild laxative (Milk of Magnesia or Miralax) should be taken according to package instructions if there are no bowel movements after 48 hours.  WOUND/INCISION CARE 8. Most patients will experience some swelling and bruising in the area of the incisions.  Ice packs will help.  Swelling and bruising can take several days to resolve.  9. Unless discharge instructions indicate otherwise, follow guidelines below  a. STERI-STRIPS - you may remove your outer bandages 48 hours after surgery, and you may shower at that time.  You have steri-strips (small skin tapes) in place directly over the incision.  These strips should be left on the skin for 7-10 days.   b. DERMABOND/SKIN GLUE - you may shower in 24 hours.  The glue will flake off over the next 2-3 weeks. 10. Any sutures or staples will be removed at the office during your follow-up visit.  ACTIVITIES 11. You may resume regular (light) daily activities beginning the next day--such as daily self-care, walking, climbing stairs--gradually increasing activities as tolerated.  You may have sexual intercourse when it is comfortable.  Refrain from any heavy lifting or straining until approved by your doctor. a. You may drive when you are no longer taking prescription pain medication, you can comfortably wear a seatbelt, and you can safely maneuver your car and apply brakes.  FOLLOW-UP 12. You should see your  doctor in the office for a follow-up appointment approximately 2-3 weeks after your surgery.  You should have been given your post-op/follow-up appointment when your surgery was scheduled.  If you did not receive a post-op/follow-up appointment, make sure that you call for this appointment within a day or two after you arrive home to insure a  convenient appointment time.   WHEN TO CALL YOUR DOCTOR: 1. Fever over 101.0 2. Inability to urinate 3. Continued bleeding from incision. 4. Increased pain, redness, or drainage from the incision. 5. Increasing abdominal pain  The clinic staff is available to answer your questions during regular business hours.  Please dont hesitate to call and ask to speak to one of the nurses for clinical concerns.  If you have a medical emergency, go to the nearest emergency room or call 911.  A surgeon from [facility] is always on call at the hospital. [address] [place], [place], [place]  [address] ? [address], [place], [place]   [phone] ? [phone] ? FAX [phone]    Managing Your Pain After Surgery Without Opioids    Thank you for participating in our program to help patients manage their pain after surgery without opioids. This is part of our effort to provide you with the best care possible, without exposing you or your family to the risk that opioids pose.  What pain can I expect after surgery? You can expect to have some pain after surgery. This is normal. The pain is typically worse the day after surgery, and quickly begins to get better. Many studies have found that many patients are able to manage their pain after surgery with Over-the-Counter (OTC) medications such as Tylenol and Motrin. If you have a condition that does not allow you to take Tylenol or Motrin, notify your surgical team.  How will I manage my pain? The best strategy for controlling your pain after surgery is around the clock pain control with Tylenol (acetaminophen) and Motrin (ibuprofen or Advil). Alternating these medications with each other allows you to maximize your pain control. In addition to Tylenol and Motrin, you can use heating pads or ice packs on your incisions to help reduce your pain.  How will I alternate your regular strength over-the-counter  pain medication? You will take a dose of pain medication every three hours. ; Start by taking 650 mg of Tylenol (2 pills of 325 mg) ; 3 hours later take 600 mg of Motrin (3 pills of 200 mg) ; 3 hours after taking the Motrin take 650 mg of Tylenol ; 3 hours after that take 600 mg of Motrin.   - 1 -  See example - if your first dose of Tylenol is at 12:00 PM   12:00 PM Tylenol 650 mg (2 pills of 325 mg)  3:00 PM Motrin 600 mg (3 pills of 200 mg)  6:00 PM Tylenol 650 mg (2 pills of 325 mg)  9:00 PM Motrin 600 mg (3 pills of 200 mg)  Continue alternating every 3 hours   We recommend that you follow this schedule around-the-clock for at least 3 days after surgery, or until you feel that it is no longer needed. Use the table on the last page of this handout to keep track of the medications you are taking. Important: Do not take more than 3000mg  of Tylenol or 3200mg  of Motrin in a 24-hour period. Do not take ibuprofen/Motrin if you have a history of bleeding stomach ulcers,  severe kidney disease, &/or actively taking a blood thinner  What if I still have pain? If you have pain that is not controlled with the over-the-counter pain medications (Tylenol and Motrin or Advil) you might have what we call breakthrough pain. You will receive a prescription for a small amount of an opioid pain medication such as Oxycodone, Tramadol, or Tylenol with Codeine. Use these opioid pills in the first 24 hours after surgery if you have breakthrough pain. Do not take more than 1 pill every 4-6 hours.  If you still have uncontrolled pain after using all opioid pills, don't hesitate to call our staff using the number provided. We will help make sure you are managing your pain in the best way possible, and if necessary, we can provide a prescription for additional pain medication.   Day 1    Time  Name of Medication Number of pills taken  Amount of Acetaminophen  Pain Level   Comments  AM PM       AM PM        AM PM       AM PM       AM PM       AM PM       AM PM       AM PM       Total Daily amount of Acetaminophen Do not take more than  3,000 mg per day      Day 2    Time  Name of Medication Number of pills taken  Amount of Acetaminophen  Pain Level   Comments  AM PM       AM PM       AM PM       AM PM       AM PM       AM PM       AM PM       AM PM       Total Daily amount of Acetaminophen Do not take more than  3,000 mg per day      Day 3    Time  Name of Medication Number of pills taken  Amount of Acetaminophen  Pain Level   Comments  AM PM       AM PM       AM PM       AM PM          AM PM       AM PM       AM PM       AM PM       Total Daily amount of Acetaminophen Do not take more than  3,000 mg per day      Day 4    Time  Name of Medication Number of pills taken  Amount of Acetaminophen  Pain Level   Comments  AM PM       AM PM       AM PM       AM PM       AM PM       AM PM       AM PM       AM PM       Total Daily amount of Acetaminophen Do not take more than  3,000 mg per day      Day 5    Time  Name of Medication Number of pills taken  Amount of Acetaminophen  Pain Level   Comments  AM PM       AM PM       AM PM       AM PM       AM PM       AM PM       AM PM       AM PM       Total Daily amount of Acetaminophen Do not take more than  3,000 mg per day       Day 6    Time  Name of Medication Number of pills taken  Amount of Acetaminophen  Pain Level  Comments  AM PM       AM PM       AM PM       AM PM       AM PM       AM PM       AM PM       AM PM       Total Daily amount of Acetaminophen Do not take more than  3,000 mg per day      Day 7    Time  Name of Medication Number of pills taken  Amount of Acetaminophen  Pain Level   Comments  AM PM       AM PM       AM PM       AM PM       AM PM       AM PM       AM PM       AM PM       Total Daily amount of Acetaminophen Do not  take more than  3,000 mg per day        For additional information about how and where to safely dispose of unused opioid medications - [employer]  Disclaimer: This document contains information and/or instructional materials adapted from [place] for the typical patient with your condition. It does not replace medical advice from your health care provider because your experience may differ from that of the typical patient. Talk to your health care provider if you have any questions about this document, your condition or your treatment plan. Adapted from [place]
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
## 2018-09-01 NOTE — TOC Transition Note (Signed)
Transition of Care [facility]) - CM/SW Discharge Note   Patient Details  Name: Jorge Mckinney MRN: [id number] Date of Birth: 10/04/1995  [facility] of [facility]) CM/SW Contact:  Ella Bodo, RN Phone Number: 09/01/2018, 2:44 PM   Clinical Narrative:  Pt medically stable for discharge home; he plans to go home with his mother to recover.  Will refer to [facility] for [place] PT/OT, as pt is uninsured.  [place] agency to follow up with pt at home.  Pt to dc to mother's address: Mom: Raford Pitcher [address] [place], [place] [address] Phone: [phone] DME has been delivered to pt's room.  Spoke with pt's mother by phone; answered all questions regarding home care, DME and dc meds.  [employer] to follow up with pt's mother to complete charity application.  Meds filled through [place] using and delivered to bedside prior to dc.       Final next level of care: [place] Barriers to Discharge: No Barriers Identified   Patient Goals and CMS Choice Patient states their goals for this hospitalization and ongoing recovery are:: Patient would like to go home with family and make efforts to stop drinking   Choice offered to / list presented to : Patient                      Discharge Plan and Services In-house Referral: Clinical Social Work Discharge Planning Services: [place] [facility], Medication Assistance Post Acute Care Choice: Home Health          DME Arranged: 3-N-1, Walker rolling DME Agency: [employer] Date DME Agency Contacted: 08/31/18 Time DME Agency Contacted: 9 Representative spoke with at DME Agency: [place]: PT, OT [place] Agency: [facility] [place] Date [place]: 09/01/18 Time [place]: [place] Representative spoke with at [place]: [place] (SDOH) Interventions     Readmission Risk Interventions No flowsheet data found.  Reinaldo Raddle, RN, BSN  Trauma/Neuro ICU Case Manager [phone]
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
## 2018-09-02 NOTE — Plan of Care (Signed)

## 2019-02-04 ENCOUNTER — Emergency Department (HOSPITAL_COMMUNITY): Payer: Self-pay

## 2019-02-04 ENCOUNTER — Emergency Department (HOSPITAL_COMMUNITY)
Admission: EM | Admit: 2019-02-04 | Discharge: 2019-02-04 | Disposition: A | Discharge status: Home / Self Care | Payer: Self-pay | Attending: Emergency Medicine | Admitting: Emergency Medicine

## 2019-02-04 ENCOUNTER — Other Ambulatory Visit: Payer: Self-pay

## 2019-02-04 ENCOUNTER — Encounter (HOSPITAL_COMMUNITY): Payer: Self-pay

## 2019-02-04 DIAGNOSIS — M25512 Pain in left shoulder: Secondary | ICD-10-CM | POA: Insufficient documentation

## 2019-02-04 DIAGNOSIS — M542 Cervicalgia: Secondary | ICD-10-CM | POA: Insufficient documentation

## 2019-02-04 DIAGNOSIS — R072 Precordial pain: Secondary | ICD-10-CM | POA: Insufficient documentation

## 2019-02-04 DIAGNOSIS — F17210 Nicotine dependence, cigarettes, uncomplicated: Secondary | ICD-10-CM | POA: Insufficient documentation

## 2019-02-04 DIAGNOSIS — Z79899 Other long term (current) drug therapy: Secondary | ICD-10-CM | POA: Insufficient documentation

## 2019-02-04 NOTE — ED Triage Notes (Signed)
Pt states he has been having pain and tightness in his left arm x 3 days.  Pt states pain sometimes goes up into his left chest.
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
## 2019-02-04 NOTE — ED Notes (Signed)
Pt lying in bed on cell phone. NAD noted.

## 2019-02-04 NOTE — Discharge Instructions (Addendum)

## 2019-08-23 ENCOUNTER — Other Ambulatory Visit: Payer: Self-pay

## 2019-08-23 ENCOUNTER — Ambulatory Visit: Payer: Medicaid Other | Attending: Internal Medicine

## 2019-08-23 DIAGNOSIS — Z20822 Contact with and (suspected) exposure to covid-19: Secondary | ICD-10-CM

## 2019-08-23 DIAGNOSIS — U071 COVID-19: Secondary | ICD-10-CM

## 2019-08-23 HISTORY — DX: COVID-19: U07.1

## 2019-08-24 LAB — NOVEL CORONAVIRUS, NAA: SARS-CoV-2, NAA: DETECTED — AB

## 2019-08-24 LAB — SARS-COV-2, NAA 2 DAY TAT

## 2019-11-18 ENCOUNTER — Emergency Department (HOSPITAL_COMMUNITY)
Admission: EM | Admit: 2019-11-18 | Discharge: 2019-11-18 | Disposition: A | Discharge status: Home / Self Care | Payer: Self-pay | Attending: Emergency Medicine | Admitting: Emergency Medicine

## 2019-11-18 ENCOUNTER — Emergency Department (HOSPITAL_COMMUNITY): Payer: Self-pay

## 2019-11-18 ENCOUNTER — Other Ambulatory Visit: Payer: Self-pay

## 2019-11-18 ENCOUNTER — Encounter (HOSPITAL_COMMUNITY): Payer: Self-pay | Admitting: Emergency Medicine

## 2019-11-18 DIAGNOSIS — S43431A Superior glenoid labrum lesion of right shoulder, initial encounter: Secondary | ICD-10-CM | POA: Insufficient documentation

## 2019-11-18 DIAGNOSIS — W010XXA Fall on same level from slipping, tripping and stumbling without subsequent striking against object, initial encounter: Secondary | ICD-10-CM | POA: Insufficient documentation

## 2019-11-18 DIAGNOSIS — S43005A Unspecified dislocation of left shoulder joint, initial encounter: Secondary | ICD-10-CM | POA: Insufficient documentation

## 2019-11-18 DIAGNOSIS — F17210 Nicotine dependence, cigarettes, uncomplicated: Secondary | ICD-10-CM | POA: Insufficient documentation

## 2019-11-18 DIAGNOSIS — Y929 Unspecified place or not applicable: Secondary | ICD-10-CM | POA: Insufficient documentation

## 2019-11-18 DIAGNOSIS — Y999 Unspecified external cause status: Secondary | ICD-10-CM | POA: Insufficient documentation

## 2019-11-18 DIAGNOSIS — S42152A Displaced fracture of neck of scapula, left shoulder, initial encounter for closed fracture: Secondary | ICD-10-CM

## 2019-11-18 DIAGNOSIS — S43492A Other sprain of left shoulder joint, initial encounter: Secondary | ICD-10-CM

## 2019-11-18 DIAGNOSIS — Y9389 Activity, other specified: Secondary | ICD-10-CM | POA: Insufficient documentation

## 2019-11-18 DIAGNOSIS — S46002A Unspecified injury of muscle(s) and tendon(s) of the rotator cuff of left shoulder, initial encounter: Secondary | ICD-10-CM | POA: Insufficient documentation

## 2019-11-18 DIAGNOSIS — S62102A Fracture of unspecified carpal bone, left wrist, initial encounter for closed fracture: Secondary | ICD-10-CM

## 2019-11-18 DIAGNOSIS — S42142A Displaced fracture of glenoid cavity of scapula, left shoulder, initial encounter for closed fracture: Secondary | ICD-10-CM | POA: Insufficient documentation

## 2019-11-18 HISTORY — DX: Fracture of unspecified carpal bone, left wrist, initial encounter for closed fracture: S62.102A

## 2019-11-18 MED ORDER — NAPROXEN 500 MG PO TABS: ORAL_TABLET | ORAL | 0 refills | Status: DC

## 2019-11-18 MED ORDER — TRAMADOL HCL 50 MG PO TABS: 100.0000 mg | ORAL_TABLET | Freq: Four times a day (QID) | ORAL | 0 refills | Status: DC | PRN

## 2019-11-18 MED ORDER — KETOROLAC TROMETHAMINE 30 MG/ML IJ SOLN
30.0000 mg | Freq: Once | INTRAMUSCULAR | Status: AC
  Administered 2019-11-18: 30 mg via INTRAMUSCULAR
  Filled 2019-11-18: qty 1
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
## 2019-11-18 NOTE — ED Triage Notes (Signed)
Pt reports tripping and falling onto his left shoulder. C/O left shoulder pain.
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
## 2019-11-22 ENCOUNTER — Ambulatory Visit (INDEPENDENT_AMBULATORY_CARE_PROVIDER_SITE_OTHER): Payer: Self-pay | Admitting: Surgical

## 2019-11-22 ENCOUNTER — Ambulatory Visit: Payer: Medicaid Other | Admitting: Surgical

## 2019-11-22 DIAGNOSIS — S42142A Displaced fracture of glenoid cavity of scapula, left shoulder, initial encounter for closed fracture: Secondary | ICD-10-CM

## 2019-11-22 DIAGNOSIS — S42152A Displaced fracture of neck of scapula, left shoulder, initial encounter for closed fracture: Secondary | ICD-10-CM

## 2019-11-23 ENCOUNTER — Other Ambulatory Visit (HOSPITAL_COMMUNITY): Payer: Medicaid Other

## 2019-11-23 ENCOUNTER — Telehealth: Payer: Self-pay

## 2019-11-24 ENCOUNTER — Encounter (HOSPITAL_BASED_OUTPATIENT_CLINIC_OR_DEPARTMENT_OTHER): Payer: Self-pay | Admitting: Orthopedic Surgery

## 2019-11-24 ENCOUNTER — Other Ambulatory Visit (HOSPITAL_COMMUNITY)
Admission: RE | Admit: 2019-11-24 | Discharge: 2019-11-24 | Disposition: A | Discharge status: Home / Self Care | Payer: Medicaid Other | Source: Ambulatory Visit | Attending: Orthopedic Surgery | Admitting: Orthopedic Surgery

## 2019-11-24 ENCOUNTER — Other Ambulatory Visit: Payer: Self-pay

## 2019-11-24 DIAGNOSIS — Z20822 Contact with and (suspected) exposure to covid-19: Secondary | ICD-10-CM | POA: Insufficient documentation

## 2019-11-24 DIAGNOSIS — Z01812 Encounter for preprocedural laboratory examination: Secondary | ICD-10-CM | POA: Insufficient documentation

## 2019-11-24 LAB — SARS CORONAVIRUS 2 (TAT 6-24 HRS): SARS Coronavirus 2: NEGATIVE

## 2019-11-24 NOTE — Anesthesia Preprocedure Evaluation (Addendum)
Anesthesia Evaluation  Patient identified by MRN, date of birth, ID band Patient awake    Reviewed: Allergy & Precautions, NPO status , Patient's Chart, lab work & pertinent test results  History of Anesthesia Complications Negative for: history of anesthetic complications  Airway Mallampati: II  TM Distance: >3 FB Neck ROM: Full    Dental no notable dental hx.    Pulmonary Current Smoker and Patient abstained from smoking.,    Pulmonary exam normal        Cardiovascular negative cardio ROS Normal cardiovascular exam     Neuro/Psych  Headaches, Anxiety Depression    GI/Hepatic negative GI ROS, Neg liver ROS,   Endo/Other  negative endocrine ROS  Renal/GU negative Renal ROS  negative genitourinary   Musculoskeletal left glenoid fracture with subscapularis weakness   Abdominal   Peds  Hematology negative hematology ROS (+)   Anesthesia Other Findings Day of surgery medications reviewed with patient.  Reproductive/Obstetrics negative OB ROS                            Anesthesia Physical Anesthesia Plan  ASA: II  Anesthesia Plan: General   Post-op Pain Management: GA combined w/ Regional for post-op pain   Induction: Intravenous  PONV Risk Score and Plan: 2 and Treatment may vary due to age or medical condition, Ondansetron, Dexamethasone and Midazolam  Airway Management Planned: Oral ETT  Additional Equipment: None  Intra-op Plan:   Post-operative Plan: Extubation in OR  Informed Consent: I have reviewed the patients History and Physical, chart, labs and discussed the procedure including the risks, benefits and alternatives for the proposed anesthesia with the patient or authorized representative who has indicated his/her understanding and acceptance.     Dental advisory given  Plan Discussed with: CRNA  Anesthesia Plan Comments:        Anesthesia Quick Evaluation

## 2019-11-24 NOTE — Progress Notes (Addendum)
Addendum: spoke with [place] [facility] anesthesia mda to assess if patient needs urine drug screen day of surgery.   Spoke w/ via phone for pre-op interview---pt Lab needs dos----   NONE            Lab results------NONE COVID test -----11-24-2019 AT 1045 AM- Arrive at -------530 AM 11-25-2019 NPO after MN NO Solid Food.   WATER  from MN until---430 AM THEN NPO Medications to take morning of surgery -----NONE Diabetic medication -----N/A Patient Special Instructions -----NONE Pre-Op special Istructions -----NONE Patient verbalized understanding of instructions that were given at this phone interview. Patient denies shortness of breath, chest pain, fever, cough at this phone interview.

## 2019-11-24 NOTE — Progress Notes (Signed)
Voicemail left for Debbie, surgery scheduler for Dr. Marlou Sa, requesting orders be put in for patient's surgery.

## 2019-11-25 ENCOUNTER — Ambulatory Visit (HOSPITAL_BASED_OUTPATIENT_CLINIC_OR_DEPARTMENT_OTHER): Payer: Self-pay | Admitting: Anesthesiology

## 2019-11-25 ENCOUNTER — Encounter (HOSPITAL_BASED_OUTPATIENT_CLINIC_OR_DEPARTMENT_OTHER): Payer: Self-pay | Admitting: Orthopedic Surgery

## 2019-11-25 ENCOUNTER — Other Ambulatory Visit: Payer: Self-pay

## 2019-11-25 ENCOUNTER — Ambulatory Visit (HOSPITAL_COMMUNITY)
Admission: RE | Admit: 2019-11-25 | Discharge: 2019-11-25 | Disposition: A | Discharge status: Home / Self Care | Payer: Self-pay | Source: Ambulatory Visit | Attending: Orthopedic Surgery | Admitting: Orthopedic Surgery

## 2019-11-25 ENCOUNTER — Encounter (HOSPITAL_BASED_OUTPATIENT_CLINIC_OR_DEPARTMENT_OTHER)
Admission: RE | Disposition: A | Discharge status: Home / Self Care | Payer: Self-pay | Source: Ambulatory Visit | Attending: Orthopedic Surgery

## 2019-11-25 DIAGNOSIS — S42142P Displaced fracture of glenoid cavity of scapula, left shoulder, subsequent encounter for fracture with malunion: Secondary | ICD-10-CM

## 2019-11-25 DIAGNOSIS — X58XXXA Exposure to other specified factors, initial encounter: Secondary | ICD-10-CM | POA: Insufficient documentation

## 2019-11-25 DIAGNOSIS — M25312 Other instability, left shoulder: Secondary | ICD-10-CM | POA: Insufficient documentation

## 2019-11-25 DIAGNOSIS — S42142A Displaced fracture of glenoid cavity of scapula, left shoulder, initial encounter for closed fracture: Secondary | ICD-10-CM | POA: Insufficient documentation

## 2019-11-25 DIAGNOSIS — M7522 Bicipital tendinitis, left shoulder: Secondary | ICD-10-CM

## 2019-11-25 DIAGNOSIS — Z8616 Personal history of COVID-19: Secondary | ICD-10-CM | POA: Insufficient documentation

## 2019-11-25 DIAGNOSIS — S42152P Displaced fracture of neck of scapula, left shoulder, subsequent encounter for fracture with malunion: Secondary | ICD-10-CM

## 2019-11-25 DIAGNOSIS — F17210 Nicotine dependence, cigarettes, uncomplicated: Secondary | ICD-10-CM | POA: Insufficient documentation

## 2019-11-25 HISTORY — PX: SHOULDER OPEN ROTATOR CUFF REPAIR: SHX2407

## 2019-11-25 HISTORY — DX: Dyspnea, unspecified: R06.00

## 2019-11-25 HISTORY — DX: Migraine, unspecified, not intractable, without status migrainosus: G43.909

## 2019-11-25 HISTORY — PX: ORIF CLAVICULAR FRACTURE: SHX5055

## 2019-11-25 HISTORY — DX: Rash and other nonspecific skin eruption: R21

## 2019-11-25 LAB — POCT I-STAT, CHEM 8
BUN: 14 mg/dL (ref 6–20)
Calcium, Ion: 1.24 mmol/L (ref 1.15–1.40)
Chloride: 103 mmol/L (ref 98–111)
Creatinine, Ser: 1 mg/dL (ref 0.61–1.24)
Glucose, Bld: 106 mg/dL — ABNORMAL HIGH (ref 70–99)
HCT: 46 % (ref 39.0–52.0)
Hemoglobin: 15.6 g/dL (ref 13.0–17.0)
Potassium: 3.7 mmol/L (ref 3.5–5.1)
Sodium: 141 mmol/L (ref 135–145)
TCO2: 25 mmol/L (ref 22–32)

## 2019-11-25 LAB — BASIC METABOLIC PANEL
Anion gap: 7 (ref 5–15)
BUN: 14 mg/dL (ref 6–20)
CO2: 23 mmol/L (ref 22–32)
Calcium: 8.9 mg/dL (ref 8.9–10.3)
Chloride: 103 mmol/L (ref 98–111)
Creatinine, Ser: 1.16 mg/dL (ref 0.61–1.24)
GFR calc Af Amer: >60 mL/min (ref >60)
GFR calc non Af Amer: >60 mL/min (ref >60)
Glucose, Bld: 85 mg/dL (ref 70–99)
Potassium: 6.3 mmol/L (ref 3.5–5.1)
Sodium: 133 mmol/L — ABNORMAL LOW (ref 135–145)

## 2019-11-25 LAB — CBC
HCT: 46.7 % (ref 39.0–52.0)
Hemoglobin: 14.9 g/dL (ref 13.0–17.0)
MCH: 27.3 pg (ref 26.0–34.0)
MCHC: 31.9 g/dL (ref 30.0–36.0)
MCV: 85.5 fL (ref 80.0–100.0)
Platelets: 231 10*3/uL (ref 150–400)
RBC: 5.46 MIL/uL (ref 4.22–5.81)
RDW: 16.4 % — ABNORMAL HIGH (ref 11.5–15.5)
WBC: 8.7 10*3/uL (ref 4.0–10.5)
nRBC: 0 % (ref 0.0–0.2)

## 2019-11-25 SURGERY — OPEN REDUCTION INTERNAL FIXATION (ORIF) CLAVICULAR FRACTURE
Anesthesia: General | Site: Shoulder | Laterality: Left

## 2019-11-25 MED ORDER — TRANEXAMIC ACID-NACL 1000-0.7 MG/100ML-% IV SOLN: 1000.0000 mg | INTRAVENOUS | Status: DC

## 2019-11-25 MED ORDER — ACETAMINOPHEN 500 MG PO TABS
ORAL_TABLET | ORAL | Status: AC
  Filled 2019-11-25: qty 2

## 2019-11-25 MED ORDER — DEXAMETHASONE SODIUM PHOSPHATE 10 MG/ML IJ SOLN
INTRAMUSCULAR | Status: DC | PRN
  Administered 2019-11-25: 10 mg via INTRAVENOUS

## 2019-11-25 MED ORDER — MIDAZOLAM HCL 2 MG/2ML IJ SOLN
2.0000 mg | Freq: Once | INTRAMUSCULAR | Status: AC
  Administered 2019-11-25: 2 mg via INTRAVENOUS

## 2019-11-25 MED ORDER — PROPOFOL 10 MG/ML IV BOLUS
INTRAVENOUS | Status: DC | PRN
  Administered 2019-11-25: 120 mg via INTRAVENOUS
  Administered 2019-11-25: 80 mg via INTRAVENOUS

## 2019-11-25 MED ORDER — KETOROLAC TROMETHAMINE 30 MG/ML IJ SOLN
INTRAMUSCULAR | Status: DC | PRN
  Administered 2019-11-25: 30 mg via INTRAVENOUS

## 2019-11-25 MED ORDER — FENTANYL CITRATE (PF) 100 MCG/2ML IJ SOLN
INTRAMUSCULAR | Status: AC
  Filled 2019-11-25: qty 2

## 2019-11-25 MED ORDER — POVIDONE-IODINE 10 % EX SWAB: 2.0000 "application " | Freq: Once | CUTANEOUS | Status: DC

## 2019-11-25 MED ORDER — DEXAMETHASONE SODIUM PHOSPHATE 10 MG/ML IJ SOLN
INTRAMUSCULAR | Status: AC
  Filled 2019-11-25: qty 1

## 2019-11-25 MED ORDER — PROMETHAZINE HCL 25 MG/ML IJ SOLN: 6.2500 mg | INTRAMUSCULAR | Status: DC | PRN

## 2019-11-25 MED ORDER — BUPIVACAINE-EPINEPHRINE (PF) 0.5% -1:200000 IJ SOLN
INTRAMUSCULAR | Status: DC | PRN
  Administered 2019-11-25: 15 mL via PERINEURAL

## 2019-11-25 MED ORDER — SODIUM CHLORIDE 0.9 % IR SOLN
Status: DC | PRN
  Administered 2019-11-25: 7000 mL

## 2019-11-25 MED ORDER — OXYCODONE HCL 5 MG/5ML PO SOLN: 5.0000 mg | Freq: Once | ORAL | Status: DC | PRN

## 2019-11-25 MED ORDER — MORPHINE SULFATE (PF) 4 MG/ML IV SOLN
INTRAVENOUS | Status: DC | PRN
Start: 2019-11-25 — End: 2019-11-25
  Administered 2019-11-25: 8 mg via INTRAMUSCULAR

## 2019-11-25 MED ORDER — KETOROLAC TROMETHAMINE 30 MG/ML IJ SOLN
INTRAMUSCULAR | Status: AC
  Filled 2019-11-25: qty 1

## 2019-11-25 MED ORDER — MIDAZOLAM HCL 2 MG/2ML IJ SOLN
INTRAMUSCULAR | Status: AC
  Filled 2019-11-25: qty 2

## 2019-11-25 MED ORDER — LIDOCAINE 2% (20 MG/ML) 5 ML SYRINGE
INTRAMUSCULAR | Status: DC | PRN
  Administered 2019-11-25: 60 mg via INTRAVENOUS

## 2019-11-25 MED ORDER — MORPHINE SULFATE (PF) 4 MG/ML IV SOLN
INTRAVENOUS | Status: AC
Start: 2019-11-25 — End: ?
  Filled 2019-11-25: qty 2

## 2019-11-25 MED ORDER — LACTATED RINGERS IV SOLN: INTRAVENOUS | Status: DC

## 2019-11-25 MED ORDER — ONDANSETRON HCL 4 MG/2ML IJ SOLN
INTRAMUSCULAR | Status: AC
  Filled 2019-11-25: qty 2

## 2019-11-25 MED ORDER — BUPIVACAINE HCL (PF) 0.25 % IJ SOLN
INTRAMUSCULAR | Status: DC | PRN
  Administered 2019-11-25: 30 mL

## 2019-11-25 MED ORDER — CEFAZOLIN SODIUM-DEXTROSE 2-4 GM/100ML-% IV SOLN
2.0000 g | INTRAVENOUS | Status: AC
  Administered 2019-11-25: 2 g via INTRAVENOUS

## 2019-11-25 MED ORDER — LIDOCAINE 2% (20 MG/ML) 5 ML SYRINGE
INTRAMUSCULAR | Status: AC
  Filled 2019-11-25: qty 5

## 2019-11-25 MED ORDER — VANCOMYCIN HCL 1000 MG IV SOLR
INTRAVENOUS | Status: AC
  Filled 2019-11-25: qty 1000

## 2019-11-25 MED ORDER — OXYCODONE HCL 5 MG PO TABS: 5.0000 mg | ORAL_TABLET | Freq: Once | ORAL | Status: DC | PRN

## 2019-11-25 MED ORDER — METHOCARBAMOL 500 MG PO TABS: 500.0000 mg | ORAL_TABLET | Freq: Three times a day (TID) | ORAL | 0 refills | Status: DC | PRN

## 2019-11-25 MED ORDER — FENTANYL CITRATE (PF) 100 MCG/2ML IJ SOLN: 25.0000 ug | INTRAMUSCULAR | Status: DC | PRN

## 2019-11-25 MED ORDER — CEFAZOLIN SODIUM-DEXTROSE 2-4 GM/100ML-% IV SOLN
INTRAVENOUS | Status: AC
  Filled 2019-11-25: qty 100

## 2019-11-25 MED ORDER — OXYCODONE-ACETAMINOPHEN 5-325 MG PO TABS: 1.0000 | ORAL_TABLET | ORAL | 0 refills | Status: DC | PRN

## 2019-11-25 MED ORDER — ROCURONIUM BROMIDE 10 MG/ML (PF) SYRINGE
PREFILLED_SYRINGE | INTRAVENOUS | Status: DC | PRN
  Administered 2019-11-25: 50 mg via INTRAVENOUS

## 2019-11-25 MED ORDER — BUPIVACAINE LIPOSOME 1.3 % IJ SUSP
INTRAMUSCULAR | Status: DC | PRN
  Administered 2019-11-25: 10 mL via PERINEURAL

## 2019-11-25 MED ORDER — ACETAMINOPHEN 500 MG PO TABS
1000.0000 mg | ORAL_TABLET | Freq: Once | ORAL | Status: AC
  Administered 2019-11-25: 1000 mg via ORAL

## 2019-11-25 MED ORDER — VANCOMYCIN HCL 1000 MG IV SOLR
INTRAVENOUS | Status: DC | PRN
  Administered 2019-11-25: 1000 mg via TOPICAL

## 2019-11-25 MED ORDER — FENTANYL CITRATE (PF) 100 MCG/2ML IJ SOLN
50.0000 ug | Freq: Once | INTRAMUSCULAR | Status: AC
  Administered 2019-11-25: 50 ug via INTRAVENOUS

## 2019-11-25 MED ORDER — POVIDONE-IODINE 7.5 % EX SOLN
Freq: Once | CUTANEOUS | Status: DC
  Filled 2019-11-25: qty 118

## 2019-11-25 MED ORDER — ROCURONIUM BROMIDE 10 MG/ML (PF) SYRINGE
PREFILLED_SYRINGE | INTRAVENOUS | Status: AC
  Filled 2019-11-25: qty 10

## 2019-11-25 MED ORDER — ONDANSETRON HCL 4 MG/2ML IJ SOLN
INTRAMUSCULAR | Status: DC | PRN
  Administered 2019-11-25: 4 mg via INTRAVENOUS

## 2019-11-25 MED ORDER — PROPOFOL 10 MG/ML IV BOLUS
INTRAVENOUS | Status: AC
  Filled 2019-11-25: qty 40

## 2019-11-25 MED ORDER — CLONIDINE HCL (ANALGESIA) 100 MCG/ML EP SOLN
EPIDURAL | Status: DC | PRN
  Administered 2019-11-25: .75 mL

## 2019-11-25 MED ORDER — FENTANYL CITRATE (PF) 100 MCG/2ML IJ SOLN
INTRAMUSCULAR | Status: DC | PRN
Start: 2019-11-25 — End: 2019-11-25
  Administered 2019-11-25 (×2): 50 ug via INTRAVENOUS

## 2019-11-25 SURGICAL SUPPLY — 116 items
ALCOHOL 70% 16 OZ (MISCELLANEOUS) ×1 IMPLANT
ANCH SUT 2 19.1 W/FIBERTAPE (Anchor) ×1 IMPLANT
ANCH SUT 2 SWLK 19.1 CLS EYLT (Anchor) ×1 IMPLANT
ANCH SUT FBRTK 1.3 2 TPE (Anchor) ×2 IMPLANT
ANCHOR FBRTK 2.6 SUTURETAP 1.3 (Anchor) ×2 IMPLANT
ANCHOR SL BIO 4.75 W/FIBERTAPE (Anchor) ×1 IMPLANT
ANCHOR SUT 1.8 FBRTK KNTLS 2SU (Anchor) ×2 IMPLANT
ANCHOR SWIVELOCK BIO 4.75X19.1 (Anchor) ×1 IMPLANT
APL PRP STRL LF DISP 70% ISPRP (MISCELLANEOUS) ×3
APL SKNCLS STERI-STRIP NONHPOA (GAUZE/BANDAGES/DRESSINGS)
BENZOIN TINCTURE PRP APPL 2/3 (GAUZE/BANDAGES/DRESSINGS) ×1 IMPLANT
BIT DRILL 2.75 .066 CANNLTION (DRILL) IMPLANT
BLADE HEX COATED 2.75 (ELECTRODE) ×1 IMPLANT
BLADE OSCILLATING/SAGITTAL (BLADE) ×2
BLADE SURG 10 STRL SS (BLADE) ×1 IMPLANT
BLADE SURG 15 STRL LF DISP TIS (BLADE) IMPLANT
BLADE SURG 15 STRL SS (BLADE) ×6
BLADE SW THK.38XMED LNG THN (BLADE) IMPLANT
CHLORAPREP W/TINT 26 (MISCELLANEOUS) ×3 IMPLANT
CLSR STERI-STRIP ANTIMIC 1/2X4 (GAUZE/BANDAGES/DRESSINGS) ×1 IMPLANT
CNTNR URN SCR LID CUP LEK RST (MISCELLANEOUS) IMPLANT
CONT SPEC 4OZ STRL OR WHT (MISCELLANEOUS) ×4
COVER SURGICAL LIGHT HANDLE (MISCELLANEOUS) ×2 IMPLANT
COVER WAND RF STERILE (DRAPES) ×2 IMPLANT
DECANTER SPIKE VIAL GLASS SM (MISCELLANEOUS) IMPLANT
DRAIN PENROSE 0.5X18 (DRAIN) IMPLANT
DRAPE C-ARM 42X120 X-RAY (DRAPES) IMPLANT
DRAPE IMP U-DRAPE 54X76 (DRAPES) ×1 IMPLANT
DRAPE INCISE IOBAN 66X45 STRL (DRAPES) ×3 IMPLANT
DRAPE ORTHO SPLIT 77X108 STRL (DRAPES) ×4
DRAPE SURG ORHT 6 SPLT 77X108 (DRAPES) ×1 IMPLANT
DRAPE U-SHAPE 47X51 STRL (DRAPES) ×4 IMPLANT
DRILL 2.75 .066 CANNULATION (DRILL) ×2
DRILL BIT ×1 IMPLANT
DRSG AQUACEL AG ADV 3.5X10 (GAUZE/BANDAGES/DRESSINGS) ×1 IMPLANT
DRSG MEPILEX BORDER 4X12 (GAUZE/BANDAGES/DRESSINGS) IMPLANT
DRSG MEPILEX BORDER 4X8 (GAUZE/BANDAGES/DRESSINGS) ×1 IMPLANT
DRSG PAD ABDOMINAL 8X10 ST (GAUZE/BANDAGES/DRESSINGS) ×1 IMPLANT
DURAPREP 26ML APPLICATOR (WOUND CARE) ×1 IMPLANT
ELECT BLADE TIP CTD 4 INCH (ELECTRODE) ×1 IMPLANT
ELECT REM PT RETURN 9FT ADLT (ELECTROSURGICAL) ×2
ELECTRODE REM PT RTRN 9FT ADLT (ELECTROSURGICAL) ×1 IMPLANT
FACESHIELD WRAPAROUND (MASK) IMPLANT
FACESHIELD WRAPAROUND OR TEAM (MASK) ×1 IMPLANT
GAUZE SPONGE 4X4 12PLY STRL (GAUZE/BANDAGES/DRESSINGS) ×1 IMPLANT
GAUZE XEROFORM 1X8 LF (GAUZE/BANDAGES/DRESSINGS) ×1 IMPLANT
GAUZE XEROFORM 5X9 LF (GAUZE/BANDAGES/DRESSINGS) IMPLANT
GLOVE BIO SURGEON ST LM GN SZ9 (GLOVE) ×1 IMPLANT
GLOVE BIO SURGEON STRL SZ7 (GLOVE) ×2 IMPLANT
GLOVE BIOGEL PI IND STRL 7.0 (GLOVE) IMPLANT
GLOVE BIOGEL PI IND STRL 8 (GLOVE) ×1 IMPLANT
GLOVE BIOGEL PI INDICATOR 7.0 (GLOVE) ×2
GLOVE BIOGEL PI INDICATOR 8 (GLOVE) ×2
GLOVE ECLIPSE 8.0 STRL XLNG CF (GLOVE) ×3 IMPLANT
GOWN STRL REUS W/ TWL LRG LVL3 (GOWN DISPOSABLE) ×2 IMPLANT
GOWN STRL REUS W/ TWL XL LVL3 (GOWN DISPOSABLE) ×1 IMPLANT
GOWN STRL REUS W/TWL LRG LVL3 (GOWN DISPOSABLE) ×5 IMPLANT
GOWN STRL REUS W/TWL XL LVL3 (GOWN DISPOSABLE) ×2
GUIDEWIRE .062X12IN LONG (WIRE) ×3 IMPLANT
HYDROGEN PEROXIDE 16OZ (MISCELLANEOUS) ×1 IMPLANT
K-WIRE ×3 IMPLANT
KIT BASIN OR (CUSTOM PROCEDURE TRAY) ×2 IMPLANT
KIT STR SPEAR 1.8 FBRTK DISP (KITS) ×1 IMPLANT
KIT TURNOVER CYSTO (KITS) ×1 IMPLANT
MANIFOLD NEPTUNE II (INSTRUMENTS) ×2 IMPLANT
NDL HYPO 25X1 1.5 SAFETY (NEEDLE) IMPLANT
NDL MAYO 6 CRC TAPER PT (NEEDLE) IMPLANT
NDL SAFETY ECLIPSE 18X1.5 (NEEDLE) IMPLANT
NEEDLE HYPO 18GX1.5 SHARP (NEEDLE) ×2
NEEDLE HYPO 22GX1.5 SAFETY (NEEDLE) ×1 IMPLANT
NEEDLE HYPO 25X1 1.5 SAFETY (NEEDLE) IMPLANT
NEEDLE MAYO 6 CRC TAPER PT (NEEDLE) ×2 IMPLANT
NS IRRIG 1000ML POUR BTL (IV SOLUTION) ×8 IMPLANT
NS IRRIG 500ML POUR BTL (IV SOLUTION) ×1 IMPLANT
PACK SHOULDER (CUSTOM PROCEDURE TRAY) ×2 IMPLANT
PACK UNIVERSAL I (CUSTOM PROCEDURE TRAY) ×1 IMPLANT
PAD ARMBOARD 7.5X6 YLW CONV (MISCELLANEOUS) ×2 IMPLANT
PASSER SUT SWANSON 36MM LOOP (INSTRUMENTS) ×1 IMPLANT
PENCIL BUTTON HOLSTER BLD 10FT (ELECTRODE) ×1 IMPLANT
SCREW CANN PT 3.75X32 (Screw) ×1 IMPLANT
SCREW CANN TI P/T 3.75X30 (Screw) ×1 IMPLANT
SLING ARM IMMOBILIZER LRG (SOFTGOODS) IMPLANT
SLING ARM IMMOBILIZER MED (SOFTGOODS) IMPLANT
SOL PREP PROV IODINE SCRUB 4OZ (MISCELLANEOUS) ×1 IMPLANT
SPONGE LAP 18X18 RF (DISPOSABLE) ×1 IMPLANT
SPONGE LAP 4X18 RFD (DISPOSABLE) ×4 IMPLANT
STAPLER VISISTAT 35W (STAPLE) ×2 IMPLANT
STRIP CLOSURE SKIN 1/2X4 (GAUZE/BANDAGES/DRESSINGS) ×1 IMPLANT
SUCTION FRAZIER HANDLE 10FR (MISCELLANEOUS) ×2
SUCTION TUBE FRAZIER 10FR DISP (MISCELLANEOUS) ×1 IMPLANT
SUT FIBERWIRE #2 38 T-5 BLUE (SUTURE)
SUT MON AB 3-0 SH 27 (SUTURE) ×2
SUT MON AB 3-0 SH27 (SUTURE) IMPLANT
SUT SILK 2 0 TIES 17X18 (SUTURE) ×2
SUT SILK 2-0 18XBRD TIE BLK (SUTURE) IMPLANT
SUT VIC AB 0 CT1 27 (SUTURE) ×2
SUT VIC AB 0 CT1 27XBRD ANBCTR (SUTURE) ×1 IMPLANT
SUT VIC AB 0 CTX 36 (SUTURE) ×2
SUT VIC AB 0 CTX36XBRD ANBCTRL (SUTURE) IMPLANT
SUT VIC AB 1 CT1 27 (SUTURE) ×2
SUT VIC AB 1 CT1 27XBRD ANTBC (SUTURE) IMPLANT
SUT VIC AB 1 CTX 27 (SUTURE) ×2 IMPLANT
SUT VIC AB 2-0 CT1 27 (SUTURE) ×8
SUT VIC AB 2-0 CT1 TAPERPNT 27 (SUTURE) ×1 IMPLANT
SUT VIC AB 2-0 CTB1 (SUTURE) ×2 IMPLANT
SUT VICRYL 0 UR6 27IN ABS (SUTURE) ×7 IMPLANT
SUTURE FIBERWR #2 38 T-5 BLUE (SUTURE) ×3 IMPLANT
SYR 5ML LL (SYRINGE) ×1 IMPLANT
SYR CONTROL 10ML LL (SYRINGE) ×2 IMPLANT
TOWEL GREEN STERILE FF (TOWEL DISPOSABLE) ×1 IMPLANT
TOWEL OR 17X24 BULK DISP NS (TOWEL DISPOSABLE) IMPLANT
TOWEL OR 17X26 10 PK STRL BLUE (TOWEL DISPOSABLE) ×3 IMPLANT
TRAY FOLEY MTR SLVR 16FR STAT (SET/KITS/TRAYS/PACK) IMPLANT
TUBE CONNECTING 12X1/4 (SUCTIONS) ×1 IMPLANT
WATER STERILE IRR 1000ML POUR (IV SOLUTION) ×1 IMPLANT
YANKAUER SUCT BULB TIP NO VENT (SUCTIONS) ×1 IMPLANT

## 2019-11-25 NOTE — Anesthesia Postprocedure Evaluation (Signed)
Anesthesia Post Note  Patient: Jorge Mckinney  Procedure(s) Performed: LEFT GLENOID FRACTURE OPEN REDUCTION INTERANAL FIXATION (Left Shoulder) ROTATOR CUFF REPAIR SHOULDER OPEN (Left Shoulder)     Patient location during evaluation: PACU Anesthesia Type: General Level of consciousness: awake and alert and oriented Pain management: pain level controlled Vital Signs Assessment: post-procedure vital signs reviewed and stable Respiratory status: spontaneous breathing, nonlabored ventilation and respiratory function stable Cardiovascular status: blood pressure returned to baseline Postop Assessment: no apparent nausea or vomiting Anesthetic complications: no   No complications documented.  Last Vitals:  Vitals:   11/25/19 1145 11/25/19 1200  BP: (!) 111/56 (!) 113/57  Pulse: 62 62  Resp: (!) 21 20  Temp:    SpO2: 100% 100%    Last Pain:  Vitals:   11/25/19 1145  TempSrc:   PainSc: 0-No pain                 Brennan Bailey

## 2019-11-25 NOTE — Progress Notes (Signed)
Assisted Dr. Daiva Huge with left, ultrasound guided, interscalene  block. Side rails up, monitors on throughout procedure. See vital signs in flow sheet. Tolerated Procedure well.
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
## 2019-11-25 NOTE — Anesthesia Procedure Notes (Signed)
Anesthesia Regional Block: Interscalene brachial plexus block   Pre-Anesthetic Checklist: ,, timeout performed, Correct Patient, Correct Site, Correct Laterality, Correct Procedure, Correct Position, site marked, Risks and benefits discussed, pre-op evaluation,  At surgeon's request and post-op pain management  Laterality: Left  Prep: Maximum Sterile Barrier Precautions used, chloraprep       Needles:  Injection technique: Single-shot  Needle Type: Echogenic Stimulator Needle     Needle Length: 9cm  Needle Gauge: 22     Additional Needles:   Procedures:,,,, ultrasound used (permanent image in chart),,,,  Narrative:  Start time: 11/25/2019 7:12 AM End time: 11/25/2019 7:15 AM Injection made incrementally with aspirations every 5 mL.  Performed by: Personally  Anesthesiologist: Brennan Bailey, MD  Additional Notes: Risks, benefits, and alternative discussed. Patient gave consent for procedure. Patient prepped and draped in sterile fashion. Sedation administered, patient remains easily responsive to voice. Relevant anatomy identified with ultrasound guidance. Local anesthetic given in 5cc increments with no signs or symptoms of intravascular injection. No pain or paraesthesias with injection. Patient monitored throughout procedure with signs of LAST or immediate complications. Tolerated well. Ultrasound image placed in chart.  Tawny Asal, MD

## 2019-11-25 NOTE — Transfer of Care (Signed)
Immediate Anesthesia Transfer of Care Note  Patient: Jorge Mckinney  Procedure(s) Performed: LEFT GLENOID FRACTURE OPEN REDUCTION INTERANAL FIXATION (Left Shoulder) ROTATOR CUFF REPAIR SHOULDER OPEN (Left Shoulder)  Patient Location: PACU  Anesthesia Type:General  Level of Consciousness: sedated  Airway & Oxygen Therapy: Patient Spontanous Breathing and Patient connected to nasal cannula oxygen  Post-op Assessment: Report given to RN  Post vital signs: Reviewed and stable  Last Vitals:  Vitals Value Taken Time  BP 119/59 11/25/19 1125  Temp    Pulse 73 11/25/19 1127  Resp 26 11/25/19 1127  SpO2 99 % 11/25/19 1127  Vitals shown include unvalidated device data.  Last Pain:  Vitals:   11/25/19 0550  TempSrc: Oral         Complications: No complications documented.

## 2019-11-25 NOTE — Brief Op Note (Signed)
   11/25/2019  11:04 AM  PATIENT:  Jorge Mckinney  24 y.o. male  PRE-OPERATIVE DIAGNOSIS:  left glenoid fracture with subscapularis weakness  POST-OPERATIVE DIAGNOSIS:  left glenoid fracture with subscapularis weakness  PROCEDURE:  Procedure(s): LEFT GLENOID FRACTURE OPEN REDUCTION INTERANAL FIXATION  SURGEON:  Surgeon(s): Meredith Pel, MD  ASSISTANT: magnant pa  ANESTHESIA:   general  EBL: 25 ml    Total I/O In: 100 [IV Piggyback:100] Out: 50 [Blood:50]  BLOOD ADMINISTERED: none  DRAINS: none   LOCAL MEDICATIONS USED: Marcaine morphine clonidine  SPECIMEN:  No Specimen  COUNTS:  YES  TOURNIQUET:  * No tourniquets in log *  DICTATION: .Other Dictation: Dictation Number [id number]  PLAN OF CARE: Discharge to home after PACU  PATIENT DISPOSITION:  PACU - hemodynamically stable

## 2019-11-25 NOTE — Discharge Instructions (Signed)
Post Anesthesia Home Care Instructions  Activity: Get plenty of rest for the remainder of the day. A responsible individual must stay with you for 24 hours following the procedure.  For the next 24 hours, DO NOT: -Drive a car -Paediatric nurse -Drink alcoholic beverages -Take any medication unless instructed by your physician -Make any legal decisions or sign important papers.  Meals: Start with liquid foods such as gelatin or soup. Progress to regular foods as tolerated. Avoid greasy, spicy, heavy foods. If nausea and/or vomiting occur, drink only clear liquids until the nausea and/or vomiting subsides. Call your physician if vomiting continues.  Special Instructions/Symptoms: Your throat may feel dry or sore from the anesthesia or the breathing tube placed in your throat during surgery. If this causes discomfort, gargle with warm salt water. The discomfort should disappear within 24 hours.  Regional Anesthesia Blocks  1. Numbness or the inability to move the "blocked" extremity may last from 3-48 hours after placement. The length of time depends on the medication injected and your individual response to the medication. If the numbness is not going away after 48 hours, call your surgeon.  2. The extremity that is blocked will need to be protected until the numbness is gone and the  Strength has returned. Because you cannot feel it, you will need to take extra care to avoid injury. Because it may be weak, you may have difficulty moving it or using it. You may not know what position it is in without looking at it while the block is in effect.  3. For blocks in the legs and feet, returning to weight bearing and walking needs to be done carefully. You will need to wait until the numbness is entirely gone and the strength has returned. You should be able to move your leg and foot normally before you try and bear weight or walk. You will need someone to be with you when you first try to ensure  you do not fall and possibly risk injury.  4. Bruising and tenderness at the needle site are common side effects and will resolve in a few days.  5. Persistent numbness or new problems with movement should be communicated to the surgeon.  Information for Discharge Teaching: EXPAREL (bupivacaine liposome injectable suspension)   Your surgeon or anesthesiologist gave you EXPAREL(bupivacaine) to help control your pain after surgery.   EXPAREL is a local anesthetic that provides pain relief by numbing the tissue around the surgical site.  EXPAREL is designed to release pain medication over time and can control pain for up to 72 hours.  Depending on how you respond to EXPAREL, you may require less pain medication during your recovery.  Possible side effects:  Temporary loss of sensation or ability to move in the area where bupivacaine was injected.  Nausea, vomiting, constipation  Rarely, numbness and tingling in your mouth or lips, lightheadedness, or anxiety may occur.  Call your doctor right away if you think you may be experiencing any of these sensations, or if you have other questions regarding possible side effects.  Follow all other discharge instructions given to you by your surgeon or nurse. Eat a healthy diet and drink plenty of water or other fluids.  If you return to the hospital for any reason within 96 hours following the administration of EXPAREL, it is important for health care providers to know that you have received this anesthetic. A teal colored band has been placed on your arm with the date, time and  amount of EXPAREL you have received in order to alert and inform your health care providers. Please leave this armband in place for the full 96 hours following administration, and then you may remove the band.  May remove green armband on Tuesday, August 31,2021.

## 2019-11-25 NOTE — Anesthesia Procedure Notes (Signed)
Procedure Name: Intubation Date/Time: 11/25/2019 7:50 AM Performed by: Bonney Aid, CRNA Pre-anesthesia Checklist: Patient identified, Emergency Drugs available, Suction available and Patient being monitored Patient Re-evaluated:Patient Re-evaluated prior to induction Oxygen Delivery Method: Circle system utilized Preoxygenation: Pre-oxygenation with 100% oxygen Induction Type: IV induction Ventilation: Mask ventilation without difficulty Laryngoscope Size: Mac and 4 Tube type: Oral Tube size: 7.5 mm Number of attempts: 1 Airway Equipment and Method: Stylet Placement Confirmation: ETT inserted through vocal cords under direct vision,  positive ETCO2 and breath sounds checked- equal and bilateral Secured at: 21 cm Tube secured with: Tape Dental Injury: Teeth and Oropharynx as per pre-operative assessment

## 2019-11-27 ENCOUNTER — Encounter: Payer: Self-pay | Admitting: Surgical
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
## 2019-11-28 ENCOUNTER — Encounter (HOSPITAL_BASED_OUTPATIENT_CLINIC_OR_DEPARTMENT_OTHER): Payer: Self-pay | Admitting: Orthopedic Surgery

## 2019-12-02 ENCOUNTER — Encounter: Payer: Self-pay | Admitting: Orthopedic Surgery

## 2019-12-02 ENCOUNTER — Ambulatory Visit (INDEPENDENT_AMBULATORY_CARE_PROVIDER_SITE_OTHER): Payer: Medicaid Other | Admitting: Orthopedic Surgery

## 2019-12-02 ENCOUNTER — Telehealth: Payer: Self-pay

## 2019-12-02 ENCOUNTER — Ambulatory Visit (INDEPENDENT_AMBULATORY_CARE_PROVIDER_SITE_OTHER): Payer: Self-pay

## 2019-12-02 VITALS — Ht 72.0 in | Wt 135.0 lb

## 2019-12-02 DIAGNOSIS — S42152A Displaced fracture of neck of scapula, left shoulder, initial encounter for closed fracture: Secondary | ICD-10-CM

## 2019-12-02 DIAGNOSIS — S42142A Displaced fracture of glenoid cavity of scapula, left shoulder, initial encounter for closed fracture: Secondary | ICD-10-CM

## 2019-12-02 MED ORDER — OXYCODONE-ACETAMINOPHEN 5-325 MG PO TABS
1.0000 | ORAL_TABLET | Freq: Four times a day (QID) | ORAL | 0 refills | Status: AC | PRN
Start: 2019-12-02 — End: 2020-12-01

## 2019-12-02 MED ORDER — METHOCARBAMOL 500 MG PO TABS
500.0000 mg | ORAL_TABLET | Freq: Three times a day (TID) | ORAL | 0 refills | Status: DC | PRN
Start: 2019-12-02 — End: 2019-12-21
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
## 2019-12-02 NOTE — Telephone Encounter (Signed)
Patient called he needs his prescription to be refilled for oxycodone and methocarbamol call back :[phone]

## 2019-12-02 NOTE — Telephone Encounter (Signed)
Medication has been sent and received by pharmacy. Robaxin is 3 days early and cannot be filled yet but pain medication is ready to be picked up. I have tried to reach patient multiple times but line rings busy.  Will advise if he returns call.

## 2019-12-07 ENCOUNTER — Telehealth: Payer: Self-pay | Admitting: Orthopedic Surgery

## 2019-12-07 NOTE — Telephone Encounter (Signed)
FYI:  Patient called to make follow up appointment for 2 week repeat xrays visit on 12-16-19.  Patient was also requesting his prescription for Robaxin & Percocet.  Patient had not yet picked up the prescriptions written on 12-02-19. He called [facility] to see if the scripts are still there and is headed now to pick up.

## 2019-12-16 ENCOUNTER — Ambulatory Visit (INDEPENDENT_AMBULATORY_CARE_PROVIDER_SITE_OTHER): Payer: Self-pay | Admitting: Orthopedic Surgery

## 2019-12-16 ENCOUNTER — Ambulatory Visit: Payer: Self-pay

## 2019-12-16 ENCOUNTER — Encounter: Payer: Self-pay | Admitting: Orthopedic Surgery

## 2019-12-16 VITALS — Ht 72.0 in | Wt 135.0 lb

## 2019-12-16 DIAGNOSIS — S42152A Displaced fracture of neck of scapula, left shoulder, initial encounter for closed fracture: Secondary | ICD-10-CM

## 2019-12-16 DIAGNOSIS — S42142A Displaced fracture of glenoid cavity of scapula, left shoulder, initial encounter for closed fracture: Secondary | ICD-10-CM
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
## 2019-12-19 NOTE — Addendum Note (Signed)
Addended byLaurann Montana on: 12/19/2019 09:11 AM   Modules accepted: Orders

## 2019-12-20 ENCOUNTER — Telehealth: Payer: Self-pay | Admitting: Orthopedic Surgery

## 2019-12-20 ENCOUNTER — Ambulatory Visit: Payer: Self-pay | Admitting: Physical Therapy

## 2019-12-20 NOTE — Telephone Encounter (Signed)
Patient had an appointment for PT today but overslept.  3 week follow up made with Dr Marlou Sa.  Patient will call to reschedule PT appointment.  He is also requesting a refill on the Methocarbamol.  Patient uses [facility] in [place].   Patient has questions about filing for disability. Wants to know if he understood correctly that he could be in therapy for at least a year. He is asking how to go about the process for filing.    Please call patient at [phone].

## 2019-12-21 ENCOUNTER — Other Ambulatory Visit: Payer: Self-pay | Admitting: Surgical

## 2019-12-21 MED ORDER — METHOCARBAMOL 500 MG PO TABS
500.0000 mg | ORAL_TABLET | Freq: Three times a day (TID) | ORAL | 0 refills | Status: DC | PRN
Start: 2019-12-21 — End: 2020-02-21

## 2019-12-21 NOTE — Telephone Encounter (Signed)
Please advise. Thanks.  

## 2019-12-21 NOTE — Telephone Encounter (Signed)
PT will likely be over the first 3 months after surgery with potentially another couple months after that but he could likely transition to HEP after first 2-3 months of PT.  To be honest, not sure about process for disability.  Will submit refill for robaxin
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
## 2020-01-09 ENCOUNTER — Ambulatory Visit: Payer: Self-pay

## 2020-01-09 ENCOUNTER — Ambulatory Visit (INDEPENDENT_AMBULATORY_CARE_PROVIDER_SITE_OTHER): Payer: Self-pay | Admitting: Orthopedic Surgery

## 2020-01-09 DIAGNOSIS — S42142A Displaced fracture of glenoid cavity of scapula, left shoulder, initial encounter for closed fracture: Secondary | ICD-10-CM

## 2020-01-09 DIAGNOSIS — S42152A Displaced fracture of neck of scapula, left shoulder, initial encounter for closed fracture: Secondary | ICD-10-CM

## 2020-01-09 MED ORDER — CELECOXIB 100 MG PO CAPS: 100.0000 mg | ORAL_CAPSULE | Freq: Two times a day (BID) | ORAL | 0 refills | Status: DC

## 2020-01-15 ENCOUNTER — Encounter: Payer: Self-pay | Admitting: Orthopedic Surgery
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
## 2020-01-18 ENCOUNTER — Ambulatory Visit (HOSPITAL_COMMUNITY): Payer: Self-pay | Admitting: Occupational Therapy

## 2020-01-20 ENCOUNTER — Ambulatory Visit (HOSPITAL_COMMUNITY): Payer: Self-pay | Attending: Surgical | Admitting: Specialist

## 2020-01-30 ENCOUNTER — Ambulatory Visit (HOSPITAL_COMMUNITY): Payer: Self-pay | Attending: Surgical

## 2020-01-30 ENCOUNTER — Other Ambulatory Visit: Payer: Self-pay

## 2020-01-30 ENCOUNTER — Encounter (HOSPITAL_COMMUNITY): Payer: Self-pay

## 2020-01-30 DIAGNOSIS — R29898 Other symptoms and signs involving the musculoskeletal system: Secondary | ICD-10-CM | POA: Insufficient documentation

## 2020-01-30 DIAGNOSIS — M25512 Pain in left shoulder: Secondary | ICD-10-CM | POA: Insufficient documentation

## 2020-01-30 DIAGNOSIS — M25612 Stiffness of left shoulder, not elsewhere classified: Secondary | ICD-10-CM | POA: Insufficient documentation

## 2020-01-30 NOTE — Addendum Note (Signed)
Addended by: Ailene Ravel D on: 01/30/2020 06:06 PM   Modules accepted: Orders
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
## 2020-01-30 NOTE — Patient Instructions (Signed)
Complete at least 2 times a day. 10 reps each. Hold the stretch for about 3 seconds.  1) SHOULDER: Flexion On Table   Place hands on towel placed on table, elbows straight. Lean forward with you upper body, pushing towel away from body.   2) Abduction (Passive)   With arm out to side, resting on towel placed on table with palm DOWN, keeping trunk away from table, lean to the side while pushing towel away from body.    Copyright  VHI. All rights reserved.     3) Internal Rotation (Assistive)   Seated with elbow bent at right angle and held against side, slide arm on table surface in an inward arc keeping elbow anchored in place. Activity: Use this motion to brush crumbs off the table.  Copyright  VHI. All rights reserved.

## 2020-02-01 ENCOUNTER — Other Ambulatory Visit: Payer: Self-pay | Admitting: Surgical

## 2020-02-01 ENCOUNTER — Telehealth: Payer: Self-pay | Admitting: Orthopedic Surgery

## 2020-02-01 ENCOUNTER — Ambulatory Visit (HOSPITAL_COMMUNITY): Payer: Self-pay | Admitting: Occupational Therapy

## 2020-02-01 ENCOUNTER — Encounter (HOSPITAL_COMMUNITY): Payer: Self-pay | Admitting: Occupational Therapy

## 2020-02-01 ENCOUNTER — Other Ambulatory Visit: Payer: Self-pay

## 2020-02-01 DIAGNOSIS — M25612 Stiffness of left shoulder, not elsewhere classified: Secondary | ICD-10-CM

## 2020-02-01 DIAGNOSIS — M25512 Pain in left shoulder: Secondary | ICD-10-CM

## 2020-02-01 DIAGNOSIS — R29898 Other symptoms and signs involving the musculoskeletal system: Secondary | ICD-10-CM

## 2020-02-01 MED ORDER — CELECOXIB 100 MG PO CAPS
100.0000 mg | ORAL_CAPSULE | Freq: Two times a day (BID) | ORAL | 0 refills | Status: DC
Start: 2020-02-01 — End: 2020-02-10

## 2020-02-01 NOTE — Patient Instructions (Signed)
  AROM: Forearm Pronation / Supination   With right arm in handshake position, slowly rotate palm down until stretch is felt. Relax. Then rotate palm up until stretch is felt. Repeat __10__ times per set. Do ____ sets per session. Do __3__ sessions per day.    Shoulder Exercises:   1) Seated Row   Sit up straight with elbows by your sides. Pull back with shoulders/elbows, keeping forearms straight, as if pulling back on the reins of a horse. Squeeze shoulder blades together. Repeat _10-15__times, __2-3__sets/day    2) Shoulder Elevation    Sit up straight with arms by your sides. Slowly bring your shoulders up towards your ears. Repeat_10-15__times, __2-3__ sets/day    3) Shoulder Extension    Sit up straight with both arms by your side, draw your arms back behind your waist. Keep your elbows straight. Repeat __10-15__times, __2-3__sets/day.

## 2020-02-01 NOTE — Telephone Encounter (Signed)
Please advise. Thanks.  
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
## 2020-02-01 NOTE — Telephone Encounter (Signed)
Patient called stated he never picked up his medication from the pharmacy because he didn't have the money to get it. Patient asked if the medication can be sent back to the pharmacy so that he can get it.  The number to contact patient is [phone]

## 2020-02-01 NOTE — Telephone Encounter (Signed)
Sent in

## 2020-02-06 ENCOUNTER — Ambulatory Visit: Payer: Self-pay | Admitting: Orthopedic Surgery

## 2020-02-08 ENCOUNTER — Ambulatory Visit: Payer: Self-pay | Admitting: Orthopedic Surgery

## 2020-02-08 ENCOUNTER — Encounter (HOSPITAL_COMMUNITY): Payer: Self-pay | Admitting: Occupational Therapy

## 2020-02-08 ENCOUNTER — Ambulatory Visit (HOSPITAL_COMMUNITY): Payer: Self-pay | Admitting: Occupational Therapy

## 2020-02-08 ENCOUNTER — Other Ambulatory Visit: Payer: Self-pay

## 2020-02-08 DIAGNOSIS — M25612 Stiffness of left shoulder, not elsewhere classified: Secondary | ICD-10-CM

## 2020-02-08 DIAGNOSIS — R29898 Other symptoms and signs involving the musculoskeletal system: Secondary | ICD-10-CM

## 2020-02-08 DIAGNOSIS — M25512 Pain in left shoulder: Secondary | ICD-10-CM

## 2020-02-08 NOTE — Patient Instructions (Signed)
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
## 2020-02-10 ENCOUNTER — Telehealth: Payer: Self-pay | Admitting: Orthopedic Surgery

## 2020-02-10 ENCOUNTER — Encounter (HOSPITAL_COMMUNITY): Payer: Self-pay

## 2020-02-10 ENCOUNTER — Other Ambulatory Visit: Payer: Self-pay

## 2020-02-10 ENCOUNTER — Ambulatory Visit (HOSPITAL_COMMUNITY): Payer: Self-pay

## 2020-02-10 ENCOUNTER — Other Ambulatory Visit: Payer: Self-pay | Admitting: Surgical

## 2020-02-10 DIAGNOSIS — M25512 Pain in left shoulder: Secondary | ICD-10-CM

## 2020-02-10 DIAGNOSIS — R29898 Other symptoms and signs involving the musculoskeletal system: Secondary | ICD-10-CM

## 2020-02-10 DIAGNOSIS — M25612 Stiffness of left shoulder, not elsewhere classified: Secondary | ICD-10-CM

## 2020-02-10 MED ORDER — CELECOXIB 100 MG PO CAPS
100.0000 mg | ORAL_CAPSULE | Freq: Two times a day (BID) | ORAL | 0 refills | Status: DC
Start: 2020-02-10 — End: 2020-02-21

## 2020-02-10 NOTE — Telephone Encounter (Signed)
See below
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
## 2020-02-10 NOTE — Telephone Encounter (Signed)
Called and advised rx has been sent to pharm.

## 2020-02-10 NOTE — Telephone Encounter (Signed)
Patient is requesting the same prescriptions that were called in at his last visit.  He did not go and pick up at [facility] because he did not have enough money to get them.  The pharmacy told the patient he would have to all the office and request again. Patient is scheduled for a return office visit with Dr. Marlou Sa on 02-22-20. He states he missed his appointment with Dr. Marlou Sa two days ago because he didn't realize he had  his Physical Therapy appointment and the appointment with Dr. Marlou Sa on the same day.    cb [phone]
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
## 2020-02-13 ENCOUNTER — Encounter (HOSPITAL_COMMUNITY): Payer: Self-pay

## 2020-02-15 ENCOUNTER — Other Ambulatory Visit: Payer: Self-pay

## 2020-02-15 ENCOUNTER — Ambulatory Visit (HOSPITAL_COMMUNITY): Payer: Self-pay

## 2020-02-15 DIAGNOSIS — R29898 Other symptoms and signs involving the musculoskeletal system: Secondary | ICD-10-CM

## 2020-02-15 DIAGNOSIS — M25512 Pain in left shoulder: Secondary | ICD-10-CM

## 2020-02-15 DIAGNOSIS — M25612 Stiffness of left shoulder, not elsewhere classified: Secondary | ICD-10-CM
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
## 2020-02-21 ENCOUNTER — Other Ambulatory Visit: Payer: Self-pay | Admitting: Surgical

## 2020-02-21 ENCOUNTER — Other Ambulatory Visit: Payer: Self-pay

## 2020-02-21 ENCOUNTER — Encounter (HOSPITAL_COMMUNITY): Payer: Self-pay | Admitting: Occupational Therapy

## 2020-02-21 ENCOUNTER — Ambulatory Visit (HOSPITAL_COMMUNITY): Payer: Self-pay | Admitting: Occupational Therapy

## 2020-02-21 ENCOUNTER — Telehealth: Payer: Self-pay | Admitting: Orthopedic Surgery

## 2020-02-21 DIAGNOSIS — M25612 Stiffness of left shoulder, not elsewhere classified: Secondary | ICD-10-CM

## 2020-02-21 DIAGNOSIS — R29898 Other symptoms and signs involving the musculoskeletal system: Secondary | ICD-10-CM

## 2020-02-21 DIAGNOSIS — M25512 Pain in left shoulder: Secondary | ICD-10-CM

## 2020-02-21 MED ORDER — METHOCARBAMOL 500 MG PO TABS: 500.0000 mg | ORAL_TABLET | Freq: Three times a day (TID) | ORAL | 0 refills | Status: DC | PRN

## 2020-02-21 MED ORDER — CELECOXIB 100 MG PO CAPS
100.0000 mg | ORAL_CAPSULE | Freq: Two times a day (BID) | ORAL | 0 refills | Status: DC
Start: 2020-02-21 — End: 2024-01-30

## 2020-02-21 NOTE — Telephone Encounter (Signed)
Please advise. Thanks.  
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
## 2020-02-21 NOTE — Telephone Encounter (Signed)
Sent in RX for robaxin and celebrex.  Too far from surgery for oxy.  Also needs return office visit, especially if he is requesting pain meds

## 2020-02-21 NOTE — Telephone Encounter (Signed)
Patient called. He would like Robaxin and Oxycodone called in to [place], [place]. His call back number is [phone]

## 2020-02-22 ENCOUNTER — Ambulatory Visit (INDEPENDENT_AMBULATORY_CARE_PROVIDER_SITE_OTHER): Payer: Self-pay | Admitting: Orthopedic Surgery

## 2020-02-22 ENCOUNTER — Ambulatory Visit (HOSPITAL_COMMUNITY): Payer: Self-pay | Admitting: Occupational Therapy

## 2020-02-22 ENCOUNTER — Encounter (HOSPITAL_COMMUNITY): Payer: Self-pay | Admitting: Occupational Therapy

## 2020-02-22 DIAGNOSIS — S42152A Displaced fracture of neck of scapula, left shoulder, initial encounter for closed fracture: Secondary | ICD-10-CM

## 2020-02-22 DIAGNOSIS — M25512 Pain in left shoulder: Secondary | ICD-10-CM

## 2020-02-22 DIAGNOSIS — R29898 Other symptoms and signs involving the musculoskeletal system: Secondary | ICD-10-CM

## 2020-02-22 DIAGNOSIS — M25612 Stiffness of left shoulder, not elsewhere classified: Secondary | ICD-10-CM

## 2020-02-22 DIAGNOSIS — S42142A Displaced fracture of glenoid cavity of scapula, left shoulder, initial encounter for closed fracture: Secondary | ICD-10-CM
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
## 2020-02-22 NOTE — Telephone Encounter (Signed)
Can discuss at patients appointment today

## 2020-02-25 ENCOUNTER — Encounter: Payer: Self-pay | Admitting: Orthopedic Surgery
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
## 2020-02-28 ENCOUNTER — Ambulatory Visit (HOSPITAL_COMMUNITY): Payer: Self-pay | Admitting: Occupational Therapy

## 2020-02-28 ENCOUNTER — Other Ambulatory Visit: Payer: Self-pay

## 2020-02-28 ENCOUNTER — Encounter (HOSPITAL_COMMUNITY): Payer: Self-pay | Admitting: Occupational Therapy

## 2020-02-28 DIAGNOSIS — R29898 Other symptoms and signs involving the musculoskeletal system: Secondary | ICD-10-CM

## 2020-02-28 DIAGNOSIS — M25612 Stiffness of left shoulder, not elsewhere classified: Secondary | ICD-10-CM

## 2020-02-28 DIAGNOSIS — M25512 Pain in left shoulder: Secondary | ICD-10-CM
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
## 2020-02-28 NOTE — Patient Instructions (Signed)
°  1) Flexion Wall Stretch    Face wall, place affected handon wall in front of you. Slide hand up the wall  and lean body in towards the wall. Hold for 10 seconds. Repeat 3-5 times. 1-2 times/day.     2) Towel Stretch with Internal Rotation      Gently pull up (or to the side) your affected arm  behind your back with the assist of a towel. Hold 10 seconds, repeat 3-5 times. 1-2 times/day.             3) Corner Stretch    Stand at a corner of a wall, place your arms on the walls with elbows bent. Lean into the corner until a stretch is felt along the front of your chest and/or shoulders. Hold for 10 seconds. Repeat 3-5X, 1-2 times/day.    4) Posterior Capsule Stretch    Bring the involved arm across chest. Grasp elbow and pull toward chest until you feel a stretch in the back of the upper arm and shoulder. Hold 10 seconds. Repeat 3-5X. Complete 1-2 times/day.     5) External Rotation Stretch:     Place your affected hand on the wall with the elbow bent and gently turn your body the opposite direction until a stretch is felt. Hold 10 seconds, repeat 3-5X. Complete 1-2 times/day.      

## 2020-03-01 ENCOUNTER — Ambulatory Visit (HOSPITAL_COMMUNITY): Payer: Self-pay | Attending: Surgical | Admitting: Occupational Therapy

## 2020-03-01 ENCOUNTER — Encounter (HOSPITAL_COMMUNITY): Payer: Self-pay | Admitting: Occupational Therapy

## 2020-03-01 ENCOUNTER — Other Ambulatory Visit: Payer: Self-pay

## 2020-03-01 DIAGNOSIS — M25512 Pain in left shoulder: Secondary | ICD-10-CM | POA: Insufficient documentation

## 2020-03-01 DIAGNOSIS — R29898 Other symptoms and signs involving the musculoskeletal system: Secondary | ICD-10-CM | POA: Insufficient documentation

## 2020-03-01 DIAGNOSIS — M25612 Stiffness of left shoulder, not elsewhere classified: Secondary | ICD-10-CM | POA: Insufficient documentation

## 2020-03-01 NOTE — Patient Instructions (Signed)
  1) Flexion Wall Stretch    Face wall, place affected handon wall in front of you. Slide hand up the wall  and lean body in towards the wall. Hold for 20 seconds. Repeat 3-5 times. 1-2 times/day.     2) Towel Stretch with Internal Rotation       Gently pull up (or to the side) your affected arm  behind your back with the assist of a towel. Hold 20 seconds, repeat 3-5 times. 1-2 times/day.             3) Corner Stretch    Stand at a corner of a wall, place your arms on the walls with elbows bent. Lean into the corner until a stretch is felt along the front of your chest and/or shoulders. Hold for 20 seconds. Repeat 3-5X, 1-2 times/day.    4) Posterior Capsule Stretch    Bring the involved arm across chest. Grasp elbow and pull toward chest until you feel a stretch in the back of the upper arm and shoulder. Hold 10 seconds. Repeat 3-5X. Complete 1-2 times/day.     5) External Rotation Stretch:     Place your affected hand on the wall with the elbow bent and gently turn your body the opposite direction until a stretch is felt. Hold 20 seconds, repeat 3-5X. Complete 1-2 times/day.
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
## 2020-03-06 ENCOUNTER — Ambulatory Visit (HOSPITAL_COMMUNITY): Payer: Self-pay | Admitting: Occupational Therapy

## 2020-03-06 ENCOUNTER — Other Ambulatory Visit: Payer: Self-pay

## 2020-03-06 ENCOUNTER — Encounter (HOSPITAL_COMMUNITY): Payer: Self-pay | Admitting: Occupational Therapy

## 2020-03-06 DIAGNOSIS — M25512 Pain in left shoulder: Secondary | ICD-10-CM

## 2020-03-06 DIAGNOSIS — M25612 Stiffness of left shoulder, not elsewhere classified: Secondary | ICD-10-CM

## 2020-03-06 DIAGNOSIS — R29898 Other symptoms and signs involving the musculoskeletal system: Secondary | ICD-10-CM
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
## 2020-03-08 ENCOUNTER — Ambulatory Visit (HOSPITAL_COMMUNITY): Payer: Self-pay | Admitting: Occupational Therapy

## 2020-03-08 ENCOUNTER — Telehealth (HOSPITAL_COMMUNITY): Payer: Self-pay | Admitting: Occupational Therapy
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
## 2020-03-13 ENCOUNTER — Ambulatory Visit (HOSPITAL_COMMUNITY): Payer: Self-pay | Admitting: Occupational Therapy

## 2020-03-15 ENCOUNTER — Encounter (HOSPITAL_COMMUNITY): Payer: Self-pay | Admitting: Occupational Therapy

## 2020-03-15 ENCOUNTER — Other Ambulatory Visit: Payer: Self-pay

## 2020-03-15 ENCOUNTER — Ambulatory Visit (HOSPITAL_COMMUNITY): Payer: Self-pay | Admitting: Occupational Therapy

## 2020-03-15 DIAGNOSIS — M25512 Pain in left shoulder: Secondary | ICD-10-CM

## 2020-03-15 DIAGNOSIS — R29898 Other symptoms and signs involving the musculoskeletal system: Secondary | ICD-10-CM

## 2020-03-15 DIAGNOSIS — M25612 Stiffness of left shoulder, not elsewhere classified: Secondary | ICD-10-CM

## 2020-03-15 NOTE — Patient Instructions (Signed)
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
## 2020-03-20 ENCOUNTER — Ambulatory Visit (HOSPITAL_COMMUNITY): Payer: Self-pay | Admitting: Occupational Therapy

## 2020-03-20 ENCOUNTER — Other Ambulatory Visit: Payer: Self-pay

## 2020-03-20 ENCOUNTER — Encounter (HOSPITAL_COMMUNITY): Payer: Self-pay | Admitting: Occupational Therapy

## 2020-03-20 DIAGNOSIS — M25612 Stiffness of left shoulder, not elsewhere classified: Secondary | ICD-10-CM

## 2020-03-20 DIAGNOSIS — R29898 Other symptoms and signs involving the musculoskeletal system: Secondary | ICD-10-CM

## 2020-03-20 DIAGNOSIS — M25512 Pain in left shoulder: Secondary | ICD-10-CM
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
## 2020-03-22 ENCOUNTER — Ambulatory Visit (HOSPITAL_COMMUNITY): Payer: Self-pay | Admitting: Occupational Therapy

## 2020-03-27 ENCOUNTER — Encounter (HOSPITAL_COMMUNITY): Payer: Self-pay | Admitting: Occupational Therapy

## 2020-03-27 ENCOUNTER — Other Ambulatory Visit: Payer: Self-pay

## 2020-03-27 ENCOUNTER — Ambulatory Visit (HOSPITAL_COMMUNITY): Payer: Self-pay | Admitting: Occupational Therapy

## 2020-03-27 DIAGNOSIS — M25612 Stiffness of left shoulder, not elsewhere classified: Secondary | ICD-10-CM

## 2020-03-27 DIAGNOSIS — M25512 Pain in left shoulder: Secondary | ICD-10-CM

## 2020-03-27 DIAGNOSIS — R29898 Other symptoms and signs involving the musculoskeletal system: Secondary | ICD-10-CM
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
## 2020-03-29 ENCOUNTER — Ambulatory Visit (HOSPITAL_COMMUNITY): Payer: Self-pay | Admitting: Occupational Therapy

## 2020-04-03 ENCOUNTER — Encounter (HOSPITAL_COMMUNITY): Payer: Self-pay | Admitting: Occupational Therapy

## 2020-04-03 ENCOUNTER — Other Ambulatory Visit: Payer: Self-pay

## 2020-04-03 ENCOUNTER — Ambulatory Visit (HOSPITAL_COMMUNITY): Payer: Self-pay | Attending: Surgical | Admitting: Occupational Therapy

## 2020-04-03 DIAGNOSIS — M25512 Pain in left shoulder: Secondary | ICD-10-CM | POA: Insufficient documentation

## 2020-04-03 DIAGNOSIS — M25612 Stiffness of left shoulder, not elsewhere classified: Secondary | ICD-10-CM | POA: Insufficient documentation

## 2020-04-03 DIAGNOSIS — R29898 Other symptoms and signs involving the musculoskeletal system: Secondary | ICD-10-CM | POA: Insufficient documentation
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
## 2020-04-05 ENCOUNTER — Ambulatory Visit (HOSPITAL_COMMUNITY): Payer: Self-pay | Admitting: Occupational Therapy

## 2020-04-06 ENCOUNTER — Ambulatory Visit: Payer: Self-pay

## 2020-04-06 ENCOUNTER — Ambulatory Visit (INDEPENDENT_AMBULATORY_CARE_PROVIDER_SITE_OTHER): Payer: Self-pay | Admitting: Surgical

## 2020-04-06 DIAGNOSIS — S42142A Displaced fracture of glenoid cavity of scapula, left shoulder, initial encounter for closed fracture: Secondary | ICD-10-CM

## 2020-04-06 DIAGNOSIS — S42152A Displaced fracture of neck of scapula, left shoulder, initial encounter for closed fracture: Secondary | ICD-10-CM

## 2020-04-10 ENCOUNTER — Encounter (HOSPITAL_COMMUNITY): Payer: Self-pay | Admitting: Occupational Therapy

## 2020-04-12 ENCOUNTER — Encounter (HOSPITAL_COMMUNITY): Payer: Self-pay | Admitting: Occupational Therapy

## 2020-04-15 ENCOUNTER — Encounter: Payer: Self-pay | Admitting: Surgical
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
## 2020-04-17 ENCOUNTER — Encounter (HOSPITAL_COMMUNITY): Payer: Self-pay | Admitting: Occupational Therapy

## 2020-04-18 ENCOUNTER — Ambulatory Visit (HOSPITAL_COMMUNITY): Payer: Self-pay | Admitting: Physical Therapy

## 2020-04-18 ENCOUNTER — Other Ambulatory Visit: Payer: Self-pay

## 2020-04-18 ENCOUNTER — Encounter (HOSPITAL_COMMUNITY): Payer: Self-pay | Admitting: Physical Therapy

## 2020-04-18 DIAGNOSIS — M25512 Pain in left shoulder: Secondary | ICD-10-CM

## 2020-04-18 DIAGNOSIS — M25612 Stiffness of left shoulder, not elsewhere classified: Secondary | ICD-10-CM

## 2020-04-18 NOTE — Patient Instructions (Addendum)
Trigger Point Dry Needling  . What is Trigger Point Dry Needling (DN)? o DN is a physical therapy technique used to treat muscle pain and dysfunction. Specifically, DN helps deactivate muscle trigger points (muscle knots).  o A thin filiform needle is used to penetrate the skin and stimulate the underlying trigger point. The goal is for a local twitch response (LTR) to occur and for the trigger point to relax. No medication of any kind is injected during the procedure.   . What Does Trigger Point Dry Needling Feel Like?  o The procedure feels different for each individual patient. Some patients report that they do not actually feel the needle enter the skin and overall the process is not painful. Very mild bleeding may occur. However, many patients feel a deep cramping in the muscle in which the needle was inserted. This is the local twitch response.   Marland Kitchen How Will I feel after the treatment? o Soreness is normal, and the onset of soreness may not occur for a few hours. Typically this soreness does not last longer than two days.  o Bruising is uncommon, however; ice can be used to decrease any possible bruising.  o In rare cases feeling tired or nauseous after the treatment is normal. In addition, your symptoms may get worse before they get better, this period will typically not last longer than 24 hours.   . What Can I do After My Treatment? o Increase your hydration by drinking more water for the next 24 hours. o You may place ice or heat on the areas treated that have become sore, however, do not use heat on inflamed or bruised areas. Heat often brings more relief post needling. o You can continue your regular activities, but vigorous activity is not recommended initially after the treatment for 24 hours. o DN is best combined with other physical therapy such as strengthening, stretching, and other therapies.   Access Code: [facility] URL: [email or web address] Date:  04/18/2020 Prepared by: Josue Hector  Exercises Standing Shoulder Posterior Capsule Stretch - 2-3 x daily - 7 x weekly - 1 sets - 3 reps - 30 seconds hold Sleeper Stretch - 2-3 x daily - 7 x weekly - 1 sets - 3 reps - 30 seconds hold Sidelying Shoulder External Rotation - 2-3 x daily - 7 x weekly - 2 sets - 10 reps Shoulder Flexion Wall Slide with Towel - 2-3 x daily - 7 x weekly - 1 sets - 10 reps - 5 seconds hold Supine Shoulder Flexion AAROM - 2-3 x daily - 7 x weekly - 1 sets - 10 reps - 5 seconds hold
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
## 2020-04-19 ENCOUNTER — Encounter (HOSPITAL_COMMUNITY): Payer: Self-pay | Admitting: Occupational Therapy

## 2020-04-20 ENCOUNTER — Telehealth (HOSPITAL_COMMUNITY): Payer: Self-pay | Admitting: Physical Therapy

## 2020-04-20 ENCOUNTER — Ambulatory Visit (HOSPITAL_COMMUNITY): Payer: Self-pay | Admitting: Physical Therapy

## 2020-04-20 NOTE — Telephone Encounter (Signed)
No show 1, reminded of policy and next appt.  8:19 AM, 04/20/20 Domenic Moras, DPT Physical Therapy with [facility]  [phone] office

## 2020-04-24 ENCOUNTER — Other Ambulatory Visit: Payer: Self-pay

## 2020-04-24 ENCOUNTER — Ambulatory Visit (HOSPITAL_COMMUNITY): Payer: Self-pay | Admitting: Physical Therapy

## 2020-04-24 ENCOUNTER — Encounter (HOSPITAL_COMMUNITY): Payer: Self-pay | Admitting: Physical Therapy

## 2020-04-24 DIAGNOSIS — M25512 Pain in left shoulder: Secondary | ICD-10-CM

## 2020-04-24 DIAGNOSIS — M25612 Stiffness of left shoulder, not elsewhere classified: Secondary | ICD-10-CM
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
## 2020-04-26 ENCOUNTER — Ambulatory Visit (HOSPITAL_COMMUNITY): Payer: Self-pay | Admitting: Physical Therapy

## 2020-04-26 ENCOUNTER — Encounter (HOSPITAL_COMMUNITY): Payer: Self-pay | Admitting: Physical Therapy

## 2020-04-26 ENCOUNTER — Other Ambulatory Visit: Payer: Self-pay

## 2020-04-26 DIAGNOSIS — M25512 Pain in left shoulder: Secondary | ICD-10-CM

## 2020-04-26 DIAGNOSIS — M25612 Stiffness of left shoulder, not elsewhere classified: Secondary | ICD-10-CM
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
## 2020-05-01 ENCOUNTER — Ambulatory Visit (HOSPITAL_COMMUNITY): Payer: Self-pay | Attending: Surgical | Admitting: Physical Therapy

## 2020-05-01 ENCOUNTER — Encounter (HOSPITAL_COMMUNITY): Payer: Self-pay | Admitting: Physical Therapy

## 2020-05-01 ENCOUNTER — Other Ambulatory Visit: Payer: Self-pay

## 2020-05-01 DIAGNOSIS — M25512 Pain in left shoulder: Secondary | ICD-10-CM

## 2020-05-01 DIAGNOSIS — M25612 Stiffness of left shoulder, not elsewhere classified: Secondary | ICD-10-CM

## 2020-05-01 NOTE — Patient Instructions (Signed)
Access Code: [email or web address] URL: [email or web address] Date: 05/01/2020 Prepared by: Josue Hector  Exercises Prone Chest Stretch on Chair - 2-3 x daily - 7 x weekly - 1 sets - 3 reps - 30 seconds hold Corner Pec Minor Stretch - 2-3 x daily - 7 x weekly - 1 sets - 3 reps - 30 seconds hold Shoulder External Rotation with Anchored Resistance - 2 x daily - 7 x weekly - 2 sets - 10 reps Shoulder External Rotation Reactive Isometrics - 2 x daily - 7 x weekly - 2 sets - 10 reps - 5 second hold
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
## 2020-05-03 ENCOUNTER — Ambulatory Visit (HOSPITAL_COMMUNITY): Payer: Self-pay | Admitting: Physical Therapy

## 2020-05-03 ENCOUNTER — Telehealth (HOSPITAL_COMMUNITY): Payer: Self-pay | Admitting: Physical Therapy

## 2020-05-03 NOTE — Telephone Encounter (Signed)
No Show. Called patient but unable to leave message as voicemail was not set up.  3:47 PM, 05/03/20 Jerene Pitch, DPT Physical Therapy with [facility]  [phone] office

## 2020-05-08 ENCOUNTER — Encounter (HOSPITAL_COMMUNITY): Payer: Self-pay | Admitting: Physical Therapy

## 2020-05-08 ENCOUNTER — Ambulatory Visit (HOSPITAL_COMMUNITY): Payer: Self-pay | Admitting: Physical Therapy

## 2020-05-08 ENCOUNTER — Other Ambulatory Visit: Payer: Self-pay

## 2020-05-08 DIAGNOSIS — M25512 Pain in left shoulder: Secondary | ICD-10-CM

## 2020-05-08 DIAGNOSIS — M25612 Stiffness of left shoulder, not elsewhere classified: Secondary | ICD-10-CM
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
## 2020-05-10 ENCOUNTER — Ambulatory Visit (HOSPITAL_COMMUNITY): Payer: Self-pay | Admitting: Physical Therapy

## 2020-05-10 ENCOUNTER — Other Ambulatory Visit: Payer: Self-pay

## 2020-05-10 ENCOUNTER — Encounter (HOSPITAL_COMMUNITY): Payer: Self-pay | Admitting: Physical Therapy

## 2020-05-10 DIAGNOSIS — M25512 Pain in left shoulder: Secondary | ICD-10-CM

## 2020-05-10 DIAGNOSIS — M25612 Stiffness of left shoulder, not elsewhere classified: Secondary | ICD-10-CM
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
## 2020-05-10 NOTE — Patient Instructions (Signed)
Access Code: [email or web address] URL: [email or web address] Date: 05/10/2020 Prepared by: Josue Hector  Exercises Standing Shoulder Flexion to 90 Degrees with Dumbbells - 2 x daily - 7 x weekly - 3 sets - 10 reps Scaption with Dumbbells - 2 x daily - 7 x weekly - 3 sets - 10 reps

## 2020-05-15 ENCOUNTER — Ambulatory Visit (HOSPITAL_COMMUNITY): Payer: Self-pay | Admitting: Physical Therapy

## 2020-05-17 ENCOUNTER — Telehealth (HOSPITAL_COMMUNITY): Payer: Self-pay | Admitting: Physical Therapy

## 2020-05-17 ENCOUNTER — Ambulatory Visit (HOSPITAL_COMMUNITY): Payer: Self-pay | Admitting: Physical Therapy

## 2020-05-17 NOTE — Telephone Encounter (Signed)
No Show #1 2/15 and No show #2 2/17. Called patient about missed appointment but voicemail not set up and unable to leave message. Canceling all but next visit secondary to no show policy.   11:16 AM, 05/17/20 Jerene Pitch, DPT Physical Therapy with [facility]  [phone] office

## 2020-05-21 ENCOUNTER — Telehealth (HOSPITAL_COMMUNITY): Payer: Self-pay | Admitting: Physical Therapy

## 2020-05-21 NOTE — Telephone Encounter (Signed)
pt cancelled appt because he started a new job he will call back to reschedule

## 2020-05-22 ENCOUNTER — Ambulatory Visit (HOSPITAL_COMMUNITY): Payer: Self-pay | Admitting: Physical Therapy

## 2020-05-24 ENCOUNTER — Encounter (HOSPITAL_COMMUNITY): Payer: Self-pay | Admitting: Physical Therapy

## 2020-05-30 ENCOUNTER — Telehealth (HOSPITAL_COMMUNITY): Payer: Self-pay | Admitting: Physical Therapy

## 2020-05-30 ENCOUNTER — Ambulatory Visit (HOSPITAL_COMMUNITY): Payer: Self-pay | Attending: Surgical | Admitting: Physical Therapy

## 2020-05-30 NOTE — Telephone Encounter (Signed)
No Show, Called and tried to leave VM but no answer. Will move towards discharge if patient does not call and reschedule by  06/07/20 (1 month since last appointment).  11:22 AM, 05/30/20 Jerene Pitch, DPT Physical Therapy with [facility]  [phone] office

## 2020-06-04 ENCOUNTER — Ambulatory Visit: Payer: Self-pay | Admitting: Orthopedic Surgery

## 2020-11-13 ENCOUNTER — Telehealth: Payer: Self-pay | Admitting: Orthopedic Surgery

## 2020-11-13 NOTE — Telephone Encounter (Signed)
errror

## 2020-11-23 ENCOUNTER — Ambulatory Visit: Payer: Self-pay | Admitting: Orthopedic Surgery

## 2020-11-28 ENCOUNTER — Ambulatory Visit (INDEPENDENT_AMBULATORY_CARE_PROVIDER_SITE_OTHER): Payer: Self-pay | Admitting: Orthopedic Surgery

## 2020-11-28 ENCOUNTER — Ambulatory Visit (INDEPENDENT_AMBULATORY_CARE_PROVIDER_SITE_OTHER): Payer: Self-pay

## 2020-11-28 ENCOUNTER — Ambulatory Visit: Payer: Self-pay

## 2020-11-28 ENCOUNTER — Other Ambulatory Visit: Payer: Self-pay

## 2020-11-28 DIAGNOSIS — M25511 Pain in right shoulder: Secondary | ICD-10-CM

## 2020-11-28 DIAGNOSIS — R200 Anesthesia of skin: Secondary | ICD-10-CM

## 2020-11-28 DIAGNOSIS — M79601 Pain in right arm: Secondary | ICD-10-CM

## 2020-11-28 DIAGNOSIS — M25512 Pain in left shoulder: Secondary | ICD-10-CM
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
## 2020-11-29 ENCOUNTER — Encounter: Payer: Self-pay | Admitting: Orthopedic Surgery

## 2020-11-30 ENCOUNTER — Other Ambulatory Visit: Payer: Self-pay | Admitting: Surgical

## 2020-11-30 DIAGNOSIS — Z189 Retained foreign body fragments, unspecified material: Secondary | ICD-10-CM

## 2020-12-03 ENCOUNTER — Encounter: Payer: Self-pay | Admitting: Orthopedic Surgery

## 2020-12-11 ENCOUNTER — Ambulatory Visit (HOSPITAL_COMMUNITY)
Admission: RE | Admit: 2020-12-11 | Discharge: 2020-12-11 | Disposition: A | Discharge status: Home / Self Care | Payer: Self-pay | Source: Ambulatory Visit | Attending: Surgical | Admitting: Surgical

## 2020-12-11 ENCOUNTER — Other Ambulatory Visit: Payer: Self-pay

## 2020-12-11 ENCOUNTER — Other Ambulatory Visit: Payer: Self-pay | Admitting: Surgical

## 2020-12-11 ENCOUNTER — Ambulatory Visit (HOSPITAL_COMMUNITY)
Admission: RE | Admit: 2020-12-11 | Discharge: 2020-12-11 | Disposition: A | Discharge status: Home / Self Care | Payer: Medicaid Other | Source: Ambulatory Visit | Attending: Surgical | Admitting: Surgical

## 2020-12-11 DIAGNOSIS — M79601 Pain in right arm: Secondary | ICD-10-CM | POA: Insufficient documentation

## 2020-12-11 DIAGNOSIS — Z189 Retained foreign body fragments, unspecified material: Secondary | ICD-10-CM

## 2020-12-11 DIAGNOSIS — R200 Anesthesia of skin: Secondary | ICD-10-CM | POA: Insufficient documentation

## 2021-01-10 ENCOUNTER — Encounter (HOSPITAL_COMMUNITY): Payer: Self-pay | Admitting: Radiology

## 2021-01-17 ENCOUNTER — Telehealth: Payer: Self-pay | Admitting: Orthopedic Surgery

## 2021-01-17 NOTE — Telephone Encounter (Signed)
Patient called advised he was instructed to contact dr dean for the results of his MRI. The number to contact patient is [phone] or [phone]

## 2021-01-18 NOTE — Telephone Encounter (Signed)
Hi Jorge Mckinney.  Looks like you saw him.  He may have MS.  Can you call him and set up an MRI of the brain.  Thanks

## 2021-01-18 NOTE — Telephone Encounter (Signed)
Patient would like to discuss results of MRI Cervical Spine 12/11/2020  IMPRESSION: No disc disease.  No stenosis.   Question low level abnormal T2 signal within the spinal cord at the C3 level. Question focus of T2 signal at the pontomedullary junction. Given these questionable findings, demyelinating disease is possible. Recommend MRI of the brain.   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

## 2021-01-23 ENCOUNTER — Telehealth: Payer: Self-pay | Admitting: Orthopedic Surgery

## 2021-01-23 NOTE — Telephone Encounter (Signed)
Pt states he had a vm from Dr. Marlou Sa. Can Dr. Marlou Sa or [place] call pt at [phone].

## 2021-01-23 NOTE — Telephone Encounter (Signed)
Pt asked that we leave a VM if he doesn't answer.

## 2021-01-24 NOTE — Telephone Encounter (Signed)
Pt called asking for a CB in regards to an MRI.  [phone]

## 2021-02-03 NOTE — Telephone Encounter (Signed)
I've tried to call Jorge Mckinney like 5 times regarding his results and recommendation to proceed with MRI of brain.  No success but he does have upcoming appointment in mid-November. Just making a note

## 2021-02-11 ENCOUNTER — Ambulatory Visit (INDEPENDENT_AMBULATORY_CARE_PROVIDER_SITE_OTHER): Payer: Self-pay | Admitting: Surgical

## 2021-02-11 ENCOUNTER — Other Ambulatory Visit: Payer: Self-pay

## 2021-02-11 DIAGNOSIS — R200 Anesthesia of skin: Secondary | ICD-10-CM

## 2021-02-15 ENCOUNTER — Telehealth: Payer: Self-pay | Admitting: Orthopedic Surgery

## 2021-02-15 NOTE — Telephone Encounter (Signed)
Pt called and would like Dena to give him a call. He wants to speak about an MRI that had been done. "He already got results, he has something else to talk to him about."  CB [phone]

## 2021-02-15 NOTE — Telephone Encounter (Signed)
Please advise. Patient would like to speak with you.

## 2021-02-17 ENCOUNTER — Encounter: Payer: Self-pay | Admitting: Orthopedic Surgery
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
## 2021-02-18 NOTE — Telephone Encounter (Signed)
noted 

## 2021-02-18 NOTE — Telephone Encounter (Signed)
seen

## 2021-02-19 ENCOUNTER — Other Ambulatory Visit: Payer: Self-pay | Admitting: Surgical

## 2021-02-19 DIAGNOSIS — Z77018 Contact with and (suspected) exposure to other hazardous metals: Secondary | ICD-10-CM

## 2021-02-20 ENCOUNTER — Telehealth: Payer: Self-pay | Admitting: Orthopedic Surgery

## 2021-02-20 NOTE — Telephone Encounter (Signed)
Called patient left voicemail advised appointment is scheduled for 03/13/2021 at 3:00 pm for MRI review with Dr Marlou Sa.

## 2021-03-11 ENCOUNTER — Ambulatory Visit
Admission: RE | Admit: 2021-03-11 | Discharge: 2021-03-11 | Disposition: A | Discharge status: Home / Self Care | Payer: Self-pay | Source: Ambulatory Visit | Attending: Surgical | Admitting: Surgical

## 2021-03-11 DIAGNOSIS — R200 Anesthesia of skin: Secondary | ICD-10-CM

## 2021-03-11 DIAGNOSIS — Z77018 Contact with and (suspected) exposure to other hazardous metals: Secondary | ICD-10-CM

## 2021-03-11 MED ORDER — GADOBENATE DIMEGLUMINE 529 MG/ML IV SOLN
12.0000 mL | Freq: Once | INTRAVENOUS | Status: AC | PRN
  Administered 2021-03-11: 12 mL via INTRAVENOUS

## 2021-03-13 ENCOUNTER — Telehealth: Payer: Self-pay

## 2021-03-13 ENCOUNTER — Ambulatory Visit (INDEPENDENT_AMBULATORY_CARE_PROVIDER_SITE_OTHER): Payer: Self-pay | Admitting: Orthopedic Surgery

## 2021-03-13 ENCOUNTER — Other Ambulatory Visit: Payer: Self-pay

## 2021-03-13 DIAGNOSIS — R200 Anesthesia of skin: Secondary | ICD-10-CM

## 2021-03-13 NOTE — Telephone Encounter (Signed)
Patient would like a call back from Dr. Marlou Sa concerning MRI results.  Cb# [phone].  Please advise.  Thank you.

## 2021-03-16 ENCOUNTER — Encounter: Payer: Self-pay | Admitting: Orthopedic Surgery
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
## 2021-03-18 ENCOUNTER — Encounter: Payer: Self-pay | Admitting: Neurology

## 2021-03-18 NOTE — Addendum Note (Signed)
Addended byLaurann Montana on: 03/18/2021 09:45 AM   Modules accepted: Orders

## 2021-03-20 ENCOUNTER — Ambulatory Visit: Payer: Self-pay | Admitting: Orthopedic Surgery

## 2021-03-29 ENCOUNTER — Encounter (HOSPITAL_COMMUNITY): Payer: Self-pay

## 2021-03-29 ENCOUNTER — Emergency Department (HOSPITAL_COMMUNITY)
Admission: EM | Admit: 2021-03-29 | Discharge: 2021-03-29 | Disposition: A | Discharge status: Home / Self Care | Payer: Self-pay | Attending: Emergency Medicine | Admitting: Emergency Medicine

## 2021-03-29 ENCOUNTER — Other Ambulatory Visit: Payer: Self-pay

## 2021-03-29 DIAGNOSIS — Z8616 Personal history of COVID-19: Secondary | ICD-10-CM | POA: Insufficient documentation

## 2021-03-29 DIAGNOSIS — F17210 Nicotine dependence, cigarettes, uncomplicated: Secondary | ICD-10-CM | POA: Insufficient documentation

## 2021-03-29 DIAGNOSIS — R300 Dysuria: Secondary | ICD-10-CM | POA: Insufficient documentation

## 2021-03-29 DIAGNOSIS — K625 Hemorrhage of anus and rectum: Secondary | ICD-10-CM | POA: Insufficient documentation

## 2021-03-29 LAB — URINALYSIS, ROUTINE W REFLEX MICROSCOPIC
Bilirubin Urine: NEGATIVE
Glucose, UA: NEGATIVE mg/dL
Hgb urine dipstick: NEGATIVE
Ketones, ur: NEGATIVE mg/dL
Nitrite: NEGATIVE
Protein, ur: 30 mg/dL — AB
Specific Gravity, Urine: 1.027 (ref 1.005–1.030)
WBC, UA: >50 WBC/hpf — ABNORMAL HIGH (ref 0–5)
pH: 6 (ref 5.0–8.0)

## 2021-03-29 MED ORDER — CEPHALEXIN 500 MG PO CAPS: 500.0000 mg | ORAL_CAPSULE | Freq: Two times a day (BID) | ORAL | 0 refills | Status: AC

## 2021-03-29 MED ORDER — HYDROCORTISONE (PERIANAL) 2.5 % EX CREA: 1.0000 "application " | TOPICAL_CREAM | Freq: Two times a day (BID) | CUTANEOUS | 0 refills | Status: DC

## 2021-03-29 NOTE — Discharge Instructions (Addendum)
You have hemorrhoids, I have given you a steroid cream please apply to the area as prescribed.  Also recommend taking MiraLAX 1 capful a day for the next 7 days, this medication only works as long as you are hydrated please drink plenty of fluids.  Also recommend taking sitz bath's this will help with pain.  Your urine was consistent with a UTI, start antibiotics please take as prescribed.    Please follow your PCP as needed.  Come back to the emergency department if you develop chest pain, shortness of breath, severe abdominal pain, uncontrolled nausea, vomiting, diarrhea.

## 2021-03-29 NOTE — ED Provider Notes (Signed)
Emergency Medicine Provider Triage Evaluation Note  Jazen Spraggins , a 25 y.o. male  was evaluated in triage.  Pt complains of rectal bleeding, started today, noticed blood in the stool as well as in the commode, bright red blood, has pain with defecation, has had hemorrhoids in the past, states he been slightly constipated and has to strain.  He also notes he is having some urinary frequency dysuria denies testicular pain.  Has no other complaints..  Review of Systems  Positive: Rectal bleeding, rectal pain Negative: Nausea vomit diarrhea  Physical Exam  BP 138/65 (BP Location: Right Arm)    Pulse 72    Temp 98.7 F (37.1 C) (Oral)    Resp 18    Ht 6' (1.829 m)    Wt 61.2 kg    SpO2 99%    BMI 18.31 kg/m  Gen:   Awake, no distress   Resp:  Normal effort  MSK:   Moves extremities without difficulty  Other:    Medical Decision Making  Medically screening exam initiated at 5:46 PM.  Appropriate orders placed.  Benjamim Harnish was informed that the remainder of the evaluation will be completed by another provider, this initial triage assessment does not replace that evaluation, and the importance of remaining in the ED until their evaluation is complete.  Patient will need further work-up.   Marcello Fennel, PA-C 03/29/21 1746    Noemi Chapel, MD 03/30/21 [phone]

## 2021-03-29 NOTE — ED Triage Notes (Signed)
Pt presents to ED with complaints of bright red rectal bleeding, started today. Pt states he has hemorrhoids.
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
## 2021-03-31 LAB — URINE CULTURE: Culture: NO GROWTH

## 2021-04-02 LAB — GC/CHLAMYDIA PROBE AMP (~~LOC~~) NOT AT ARMC
Chlamydia: POSITIVE — AB
Comment: NEGATIVE
Comment: NORMAL
Neisseria Gonorrhea: NEGATIVE

## 2021-05-06 ENCOUNTER — Ambulatory Visit: Payer: Self-pay | Admitting: Neurology

## 2021-07-30 ENCOUNTER — Telehealth: Payer: Self-pay | Admitting: Orthopedic Surgery

## 2021-07-30 NOTE — Telephone Encounter (Signed)
Patient is requesting a work release form so he can go back to work ASAP. He is requesting it to be emailed to this email address ?[email or web address] ?

## 2021-07-30 NOTE — Telephone Encounter (Signed)
Okay for return to work.  Recommend following up with Neurology if he is still having numbness and similar symptoms, doesn't look like he ever saw them

## 2021-07-31 ENCOUNTER — Telehealth: Payer: Self-pay | Admitting: Orthopedic Surgery

## 2021-07-31 NOTE — Telephone Encounter (Signed)
IC talked with patient advised needed to call [facility] and reschedule appt that was not kept in February. Patient verbalized understanding  ?

## 2021-07-31 NOTE — Telephone Encounter (Signed)
Patient unable to get in to My Chart through his application on his phone.  He is requesting you e-mail the note .  [email or web address]  ?

## 2021-07-31 NOTE — Telephone Encounter (Signed)
Note emailed 
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
## 2021-07-31 NOTE — Telephone Encounter (Signed)
Tried calling patient. No answer. LM for him letting him know note was done and that it appeared he had mychart. Asked him to let us know if he still needed it emailed since he could access through [employer].  ?

## 2021-12-13 IMAGING — MR MR CERVICAL SPINE W/O CM
5 series · 40 of 48 positions shown · non-contrast
Comparison: Radiography 11/28/2020

CLINICAL DATA: Left arm pain and numbness over the last 2 months.

EXAM:
MRI CERVICAL SPINE WITHOUT CONTRAST
TECHNIQUE: Multiplanar, multisequence MR imaging of the cervical spine was
performed. No intravenous contrast was administered.

[Series 5: T1 · sagittal · 3.0mm · 0.86mm/px · 6 of 15 slices shown]
[im 1/15]
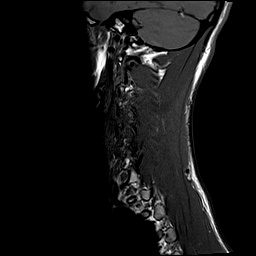
[im 3/15]
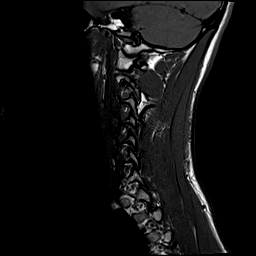
[im 6/15]
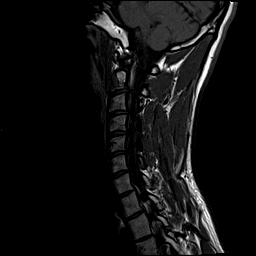
[im 9/15]
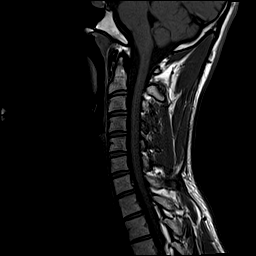
[im 12/15]
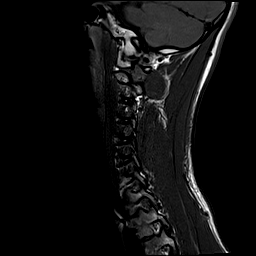
[im 15/15]
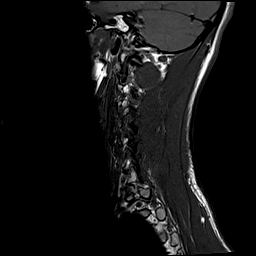

[Series 6: STIR · sagittal · 3.0mm · 0.69mm/px · 6 of 15 slices shown]
[im 1/15]
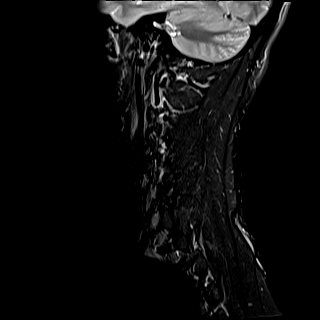
[im 3/15]
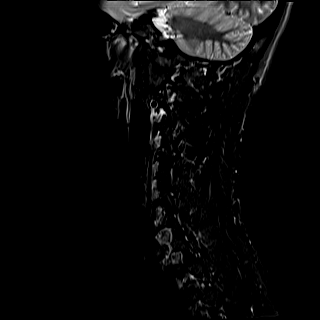
[im 6/15]
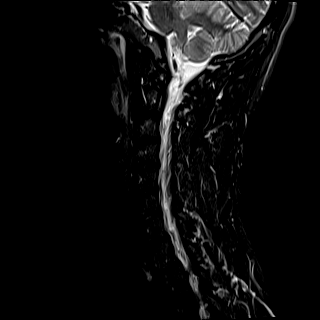
[im 9/15]
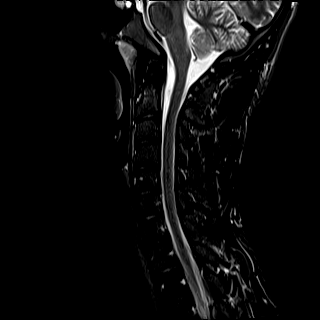
[im 12/15]
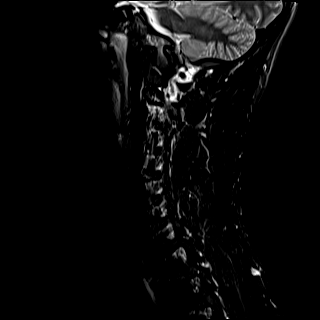
[im 15/15]
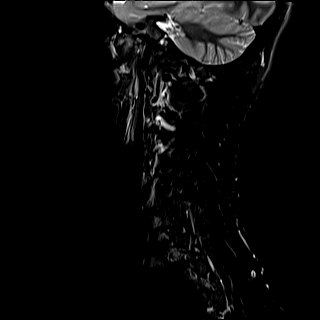

[Series 7: T2 · sagittal · 3.0mm · 0.69mm/px · 6 of 15 slices shown (1 of 2)]
[im 1/15]
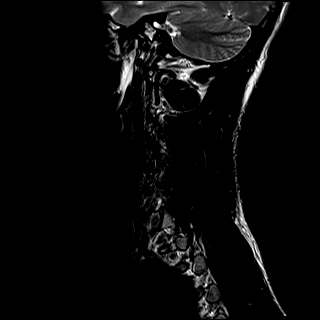
[im 3/15]
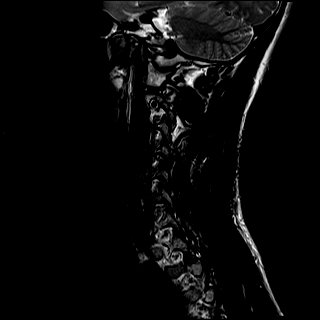
[im 6/15]
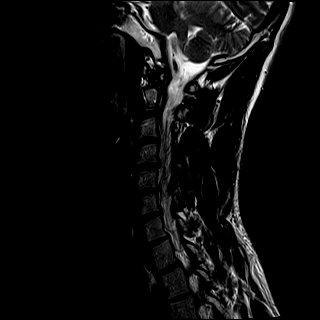
[im 9/15]
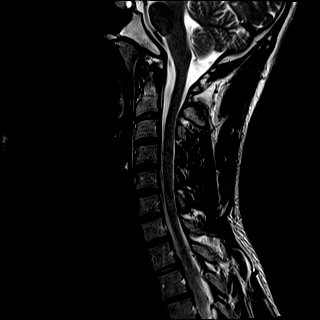
[im 12/15]
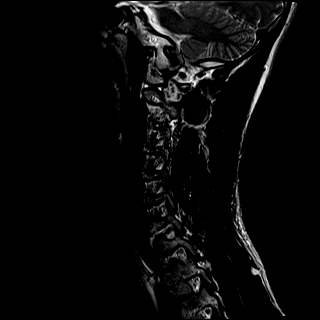
[im 15/15]
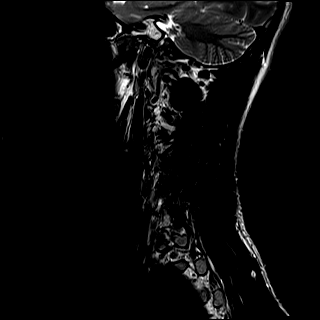

[Series 8: T2 · axial · 3.0mm · 0.70mm/px · z∈[-136,-11]mm · 14 of 37 slices shown (2 of 2)]
[im 1/37]
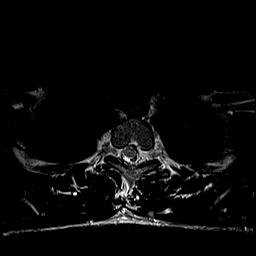
[im 3/37]
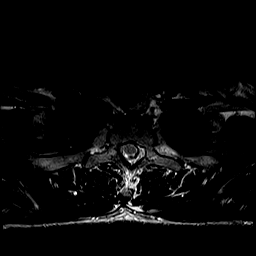
[im 6/37]
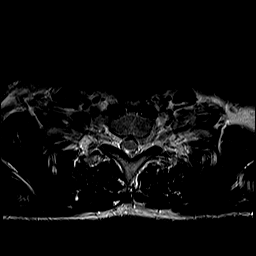
[im 8/37]
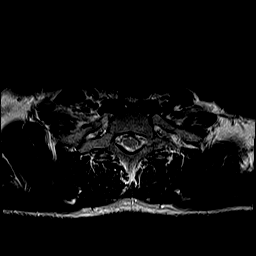
[im 11/37]
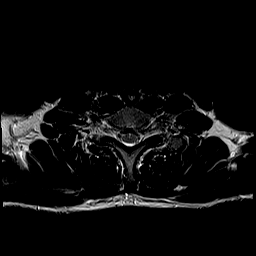
[im 13/37]
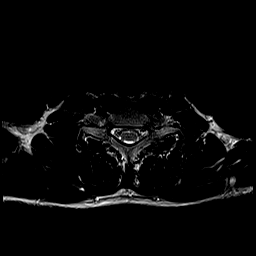
[im 16/37]
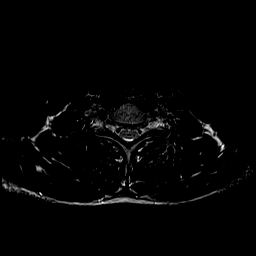
[im 19/37]
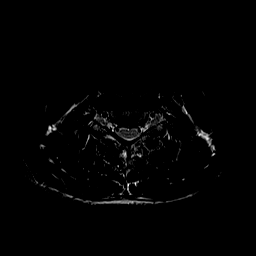
[im 21/37]
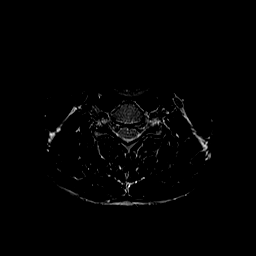
[im 24/37]
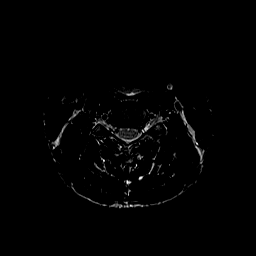
[im 26/37]
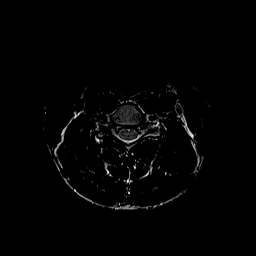
[im 29/37]
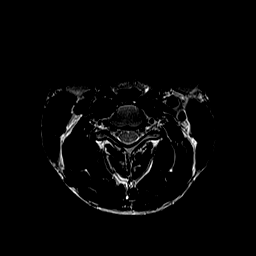
[im 31/37]
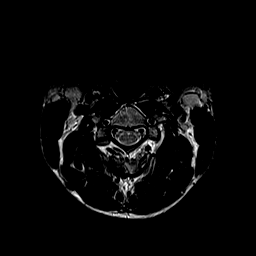
[im 37/37]
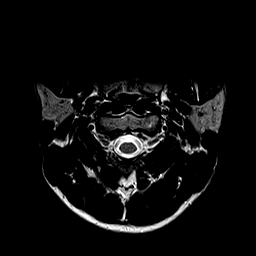

[Series 9: GRE · axial · 3.0mm · 0.35mm/px · z∈[-136,-11]mm · 8 of 38 slices shown]
[im 1/38]
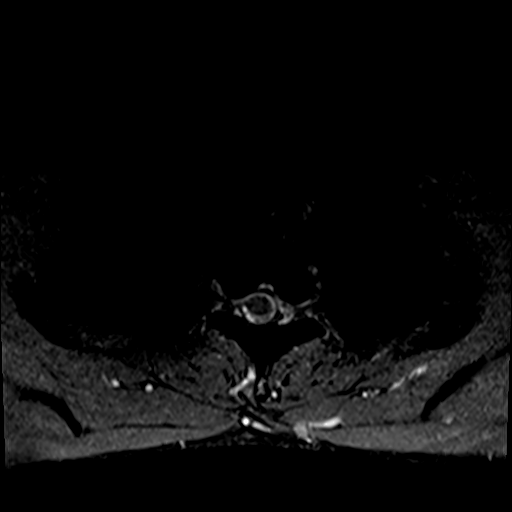
[im 6/38]
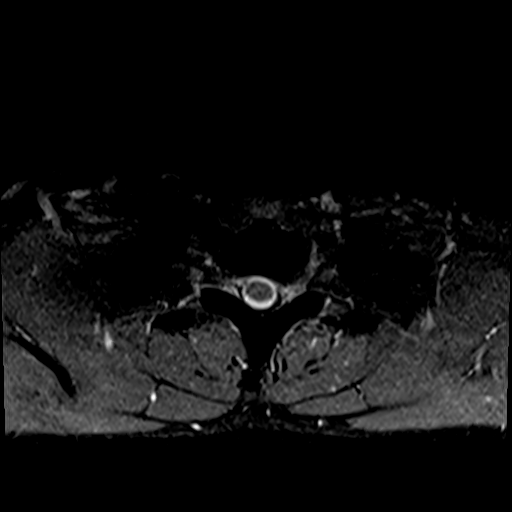
[im 11/38]
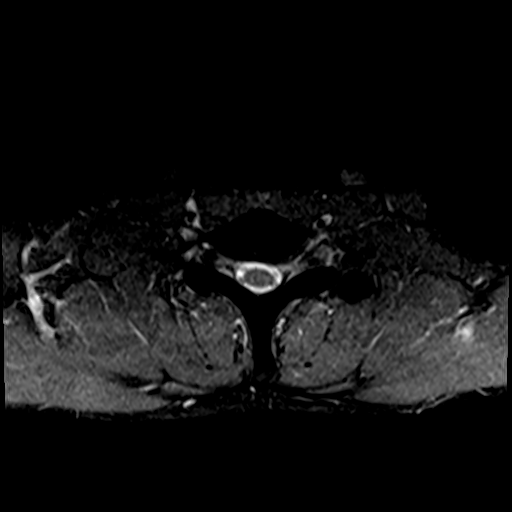
[im 16/38]
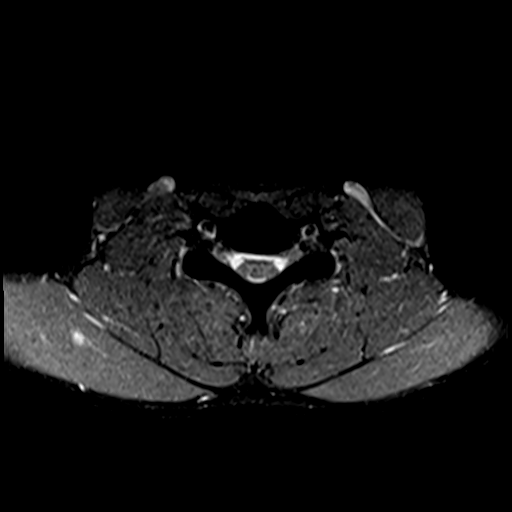
[im 22/38]
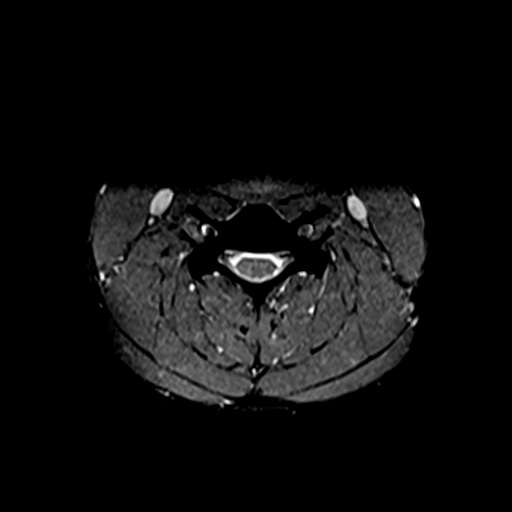
[im 27/38]
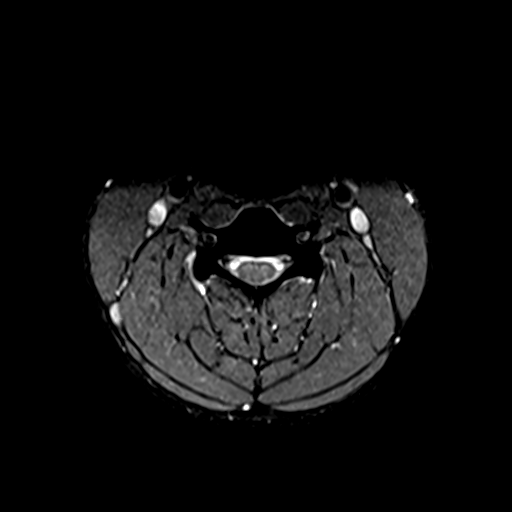
[im 32/38]
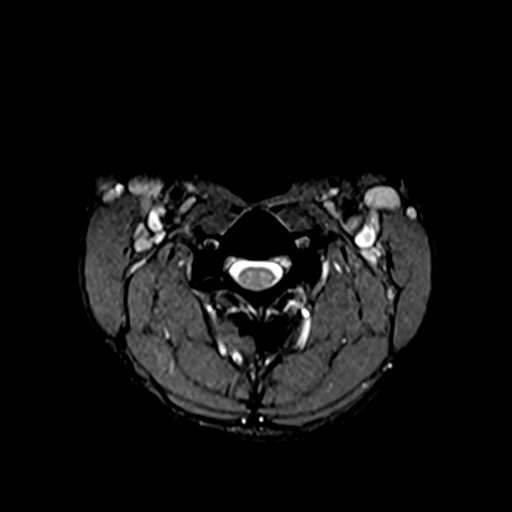
[im 38/38]
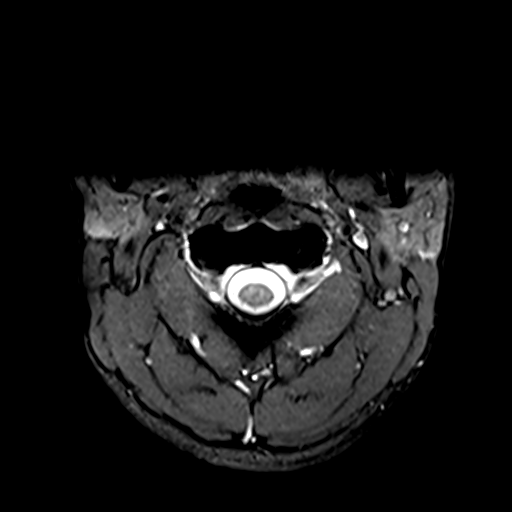

[40 of 48 positions shown; findings below may reference images not displayed]

FINDINGS: Alignment: Normal

Vertebrae: Normal

Cord: No cord compression. One could question low level T2 signal
within the cord at the C3 level. I do not think this is definite.

Posterior Fossa, vertebral arteries, paraspinal tissues: Similarly,
question focal abnormal T2 signal at the pontomedullary junction.
There appears to be a cyst in the posterior sella most likely a
Rathke's cleft cyst, not completely evaluated.

Disc levels:

No disc level pathology. No degeneration, bulge or herniation. No
stenosis of the canal or foramina. No facet arthropathy.
IMPRESSION: No disc disease.  No stenosis.

Question low level abnormal T2 signal within the spinal cord at the
C3 level. Question focus of T2 signal at the pontomedullary
junction. Given these questionable findings, demyelinating disease
is possible. Recommend MRI of the brain.

Cyst within the posterior sella, probably a Rathke's cleft cyst,
that can be re-evaluated at the time of brain MRI as well.

## 2022-03-31 ENCOUNTER — Emergency Department (HOSPITAL_COMMUNITY)
Admission: EM | Admit: 2022-03-31 | Discharge: 2022-03-31 | Disposition: A | Discharge status: Home / Self Care | Payer: Medicaid Other | Attending: Emergency Medicine | Admitting: Emergency Medicine

## 2022-03-31 ENCOUNTER — Emergency Department (HOSPITAL_COMMUNITY): Payer: Medicaid Other

## 2022-03-31 ENCOUNTER — Other Ambulatory Visit: Payer: Self-pay

## 2022-03-31 ENCOUNTER — Encounter (HOSPITAL_COMMUNITY): Payer: Self-pay | Admitting: Emergency Medicine

## 2022-03-31 DIAGNOSIS — W19XXXA Unspecified fall, initial encounter: Secondary | ICD-10-CM | POA: Insufficient documentation

## 2022-03-31 DIAGNOSIS — Z8616 Personal history of COVID-19: Secondary | ICD-10-CM | POA: Insufficient documentation

## 2022-03-31 DIAGNOSIS — M79646 Pain in unspecified finger(s): Secondary | ICD-10-CM

## 2022-03-31 DIAGNOSIS — F17210 Nicotine dependence, cigarettes, uncomplicated: Secondary | ICD-10-CM | POA: Diagnosis not present

## 2022-03-31 DIAGNOSIS — M79644 Pain in right finger(s): Secondary | ICD-10-CM | POA: Diagnosis not present

## 2022-03-31 NOTE — ED Triage Notes (Addendum)
Pt c/o falling last night and caught self with right thumb. Mild swelling noted to right proximal joint of thumb. Radial pulse strong. Nad.  Pt then report he has been depressed lately, cousin just passed away. Stated "I do be feeling like hurting people sometimes. Not just anybody though I dont have that in me".  When asked certain people he said the ones that make him mad. Denies si or wishing to be dead. Denies avh.
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
## 2023-06-14 ENCOUNTER — Emergency Department (HOSPITAL_COMMUNITY)
Admission: EM | Admit: 2023-06-14 | Discharge: 2023-06-14 | Disposition: A | Discharge status: Home / Self Care | Payer: Self-pay | Attending: Emergency Medicine | Admitting: Emergency Medicine

## 2023-06-14 ENCOUNTER — Other Ambulatory Visit: Payer: Self-pay

## 2023-06-14 ENCOUNTER — Encounter (HOSPITAL_COMMUNITY): Payer: Self-pay | Admitting: Emergency Medicine

## 2023-06-14 ENCOUNTER — Emergency Department (HOSPITAL_COMMUNITY): Payer: Self-pay

## 2023-06-14 DIAGNOSIS — W228XXA Striking against or struck by other objects, initial encounter: Secondary | ICD-10-CM | POA: Insufficient documentation

## 2023-06-14 DIAGNOSIS — S8002XA Contusion of left knee, initial encounter: Secondary | ICD-10-CM | POA: Insufficient documentation

## 2023-06-14 MED ORDER — NAPROXEN 500 MG PO TABS: 500.0000 mg | ORAL_TABLET | Freq: Two times a day (BID) | ORAL | 0 refills | Status: DC
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
## 2023-06-14 NOTE — Discharge Instructions (Addendum)
 Please follow-up closely with orthopedics for any continued symptoms.  Return to emergency department immediately for any new or worsening symptoms.

## 2023-10-04 ENCOUNTER — Other Ambulatory Visit: Payer: Self-pay

## 2023-10-04 ENCOUNTER — Emergency Department (HOSPITAL_COMMUNITY)
Admission: EM | Admit: 2023-10-04 | Discharge: 2023-10-05 | Disposition: A | Discharge status: Home / Self Care | Payer: Self-pay | Attending: Emergency Medicine | Admitting: Emergency Medicine

## 2023-10-04 DIAGNOSIS — U071 COVID-19: Secondary | ICD-10-CM | POA: Insufficient documentation

## 2023-10-04 NOTE — ED Triage Notes (Signed)
 Pt c/o lower back pain x1 day. States he has had injections for back pain in the past. Also c/o headache.

## 2023-10-05 LAB — COMPREHENSIVE METABOLIC PANEL WITH GFR
ALT: 18 U/L (ref 0–44)
AST: 24 U/L (ref 15–41)
Albumin: 4.2 g/dL (ref 3.5–5.0)
Alkaline Phosphatase: 63 U/L (ref 38–126)
Anion gap: 15 (ref 5–15)
BUN: 10 mg/dL (ref 6–20)
CO2: 21 mmol/L — ABNORMAL LOW (ref 22–32)
Calcium: 9.5 mg/dL (ref 8.9–10.3)
Chloride: 100 mmol/L (ref 98–111)
Creatinine, Ser: 1.03 mg/dL (ref 0.61–1.24)
GFR, Estimated: >60 mL/min (ref >60)
Glucose, Bld: 86 mg/dL (ref 70–99)
Potassium: 3.5 mmol/L (ref 3.5–5.1)
Sodium: 136 mmol/L (ref 135–145)
Total Bilirubin: 1 mg/dL (ref 0.0–1.2)
Total Protein: 8 g/dL (ref 6.5–8.1)

## 2023-10-05 LAB — URINALYSIS, ROUTINE W REFLEX MICROSCOPIC
Bilirubin Urine: NEGATIVE
Glucose, UA: NEGATIVE mg/dL
Hgb urine dipstick: NEGATIVE
Ketones, ur: 20 mg/dL — AB
Leukocytes,Ua: NEGATIVE
Nitrite: NEGATIVE
Protein, ur: NEGATIVE mg/dL
Specific Gravity, Urine: 1.012 (ref 1.005–1.030)
pH: 6 (ref 5.0–8.0)

## 2023-10-05 LAB — RESP PANEL BY RT-PCR (RSV, FLU A&B, COVID)  RVPGX2
Influenza A by PCR: NEGATIVE
Influenza B by PCR: NEGATIVE
Resp Syncytial Virus by PCR: NEGATIVE
SARS Coronavirus 2 by RT PCR: POSITIVE — AB

## 2023-10-05 LAB — CBC WITH DIFFERENTIAL/PLATELET
Abs Immature Granulocytes: 0.07 K/uL (ref 0.00–0.07)
Basophils Absolute: 0.1 K/uL (ref 0.0–0.1)
Basophils Relative: 1 %
Eosinophils Absolute: 0.1 K/uL (ref 0.0–0.5)
Eosinophils Relative: 1 %
HCT: 46.9 % (ref 39.0–52.0)
Hemoglobin: 15.8 g/dL (ref 13.0–17.0)
Immature Granulocytes: 1 %
Lymphocytes Relative: 6 %
Lymphs Abs: 0.6 K/uL — ABNORMAL LOW (ref 0.7–4.0)
MCH: 27.5 pg (ref 26.0–34.0)
MCHC: 33.7 g/dL (ref 30.0–36.0)
MCV: 81.6 fL (ref 80.0–100.0)
Monocytes Absolute: 1.3 K/uL — ABNORMAL HIGH (ref 0.1–1.0)
Monocytes Relative: 13 %
Neutro Abs: 8.1 K/uL — ABNORMAL HIGH (ref 1.7–7.7)
Neutrophils Relative %: 78 %
Platelets: 229 K/uL (ref 150–400)
RBC: 5.75 MIL/uL (ref 4.22–5.81)
RDW: 15.9 % — ABNORMAL HIGH (ref 11.5–15.5)
WBC: 10.2 K/uL (ref 4.0–10.5)
nRBC: 0 % (ref 0.0–0.2)

## 2023-10-05 LAB — LIPASE, BLOOD: Lipase: 27 U/L (ref 11–51)

## 2023-10-05 MED ORDER — KETOROLAC TROMETHAMINE 15 MG/ML IJ SOLN
15.0000 mg | Freq: Once | INTRAMUSCULAR | Status: AC
Start: 2023-10-05 — End: 2023-10-05
  Administered 2023-10-05: 15 mg via INTRAVENOUS
  Filled 2023-10-05: qty 1

## 2023-10-05 MED ORDER — IBUPROFEN 800 MG PO TABS: 800.0000 mg | ORAL_TABLET | Freq: Once | ORAL | Status: DC

## 2023-10-05 NOTE — ED Provider Notes (Signed)
 [place] EMERGENCY DEPARTMENT AT [facility] Provider Note   CSN: [id number] Arrival date & time: 10/04/23  2337     Patient presents with: Back Pain   Jorge Mckinney is a 28 y.o. male.   28 year old male who presents ER today secondary to multiple symptoms.  Patient states he had a headache, lower back pain, sweating that felt like a fever, mild cough and congestion started earlier tonight.  Has not been around anyone sick that he knows of.  No neurologic changes.  No nausea or vomiting.  No vision changes.  No nuchal rigidity.   Back Pain      Prior to Admission medications   Medication Sig Start Date End Date Taking? Authorizing Provider  celecoxib  (CELEBREX ) 100 MG capsule Take 1 capsule (100 mg total) by mouth 2 (two) times daily. Patient not taking: Reported on 02/11/2021 02/21/20   Magnant, Carlin CROME, PA-C  hydrocortisone  (ANUSOL -HC) 2.5 % rectal cream Place 1 application rectally 2 (two) times daily. 03/29/21   Waylan Elsie PARAS, PA-C  methocarbamol  (ROBAXIN ) 500 MG tablet Take 1 tablet (500 mg total) by mouth every 8 (eight) hours as needed for muscle spasms. Patient not taking: Reported on 02/11/2021 02/21/20   Magnant, Carlin CROME, PA-C  naproxen  (NAPROSYN ) 500 MG tablet Take 1 tablet (500 mg total) by mouth 2 (two) times daily. 06/14/23   Daralene Lonni BIRCH, PA-C  OVER THE COUNTER MEDICATION 1 VITAMIN C DAILY 1 VITAMIN D  DAILY Patient not taking: No sig reported    [provider]    Allergies: Patient has no known allergies.    Review of Systems  Musculoskeletal:  Positive for back pain.    Updated Vital Signs BP 116/63 (BP Location: Left Arm)   Pulse 77   Temp 99.1 F (37.3 C) (Oral)   Resp 18   SpO2 97%   Physical Exam Vitals and nursing note reviewed.  Constitutional:      Appearance: He is well-developed.  HENT:     Head: Normocephalic and atraumatic.     Nose: Congestion present.  Cardiovascular:     Rate and  Rhythm: Normal rate.  Pulmonary:     Effort: Pulmonary effort is normal. No respiratory distress.  Abdominal:     General: There is no distension.  Musculoskeletal:        General: Normal range of motion.     Cervical back: Normal range of motion.  Skin:    General: Skin is warm and dry.  Neurological:     General: No focal deficit present.     Mental Status: He is alert.     (all labs ordered are listed, but only abnormal results are displayed) Labs Reviewed  RESP PANEL BY RT-PCR (RSV, FLU A&B, COVID)  RVPGX2 - Abnormal; Notable for the following components:      Result Value   SARS Coronavirus 2 by RT PCR POSITIVE (*)    All other components within normal limits  URINALYSIS, ROUTINE W REFLEX MICROSCOPIC - Abnormal; Notable for the following components:   Ketones, ur 20 (*)    All other components within normal limits  CBC WITH DIFFERENTIAL/PLATELET - Abnormal; Notable for the following components:   RDW 15.9 (*)    Neutro Abs 8.1 (*)    Lymphs Abs 0.6 (*)    Monocytes Absolute 1.3 (*)    All other components within normal limits  COMPREHENSIVE METABOLIC PANEL WITH GFR - Abnormal; Notable for the following components:   CO2  21 (*)    All other components within normal limits  LIPASE, BLOOD    EKG: None  Radiology: No results found.   Procedures   Medications Ordered in the ED  ketorolac  (TORADOL ) 15 MG/ML injection 15 mg (15 mg Intravenous Given 10/05/23 0201)                                    Medical Decision Making Amount and/or Complexity of Data Reviewed Labs: ordered.  Risk Prescription drug management.   Patient found to have COVID which explains his symptoms.  Low suspicion for meningitis, epidural abscess or any other acute etiology for his symptoms.  Symptomatic care offered.  Patient will continue antipyretics at home.     Final diagnoses:  COVID    ED Discharge Orders     None          Carollee Nussbaumer, Selinda, MD 10/05/23 [phone]

## 2023-10-27 ENCOUNTER — Telehealth: Payer: Self-pay

## 2023-10-27 NOTE — Telephone Encounter (Signed)
 1st Attempt to call for follow up of former [facility] client and PCP of [facility] who no longer has Medicaid and was recently seen in the ED on 10/04/23  Called to determine if client wants to renew Medicaid to assure he is connected with PCP.  No answer left voicemail requesting return call.   Avelina JONELLE Skeen RN [facility]

## 2024-01-30 ENCOUNTER — Other Ambulatory Visit: Payer: Self-pay

## 2024-01-30 ENCOUNTER — Emergency Department (HOSPITAL_COMMUNITY): Payer: Self-pay

## 2024-01-30 ENCOUNTER — Emergency Department (HOSPITAL_COMMUNITY)
Admission: EM | Admit: 2024-01-30 | Discharge: 2024-01-30 | Disposition: A | Discharge status: Home / Self Care | Payer: Self-pay | Attending: Emergency Medicine | Admitting: Emergency Medicine

## 2024-01-30 ENCOUNTER — Encounter (HOSPITAL_COMMUNITY): Payer: Self-pay | Admitting: Emergency Medicine

## 2024-01-30 DIAGNOSIS — W172XXA Fall into hole, initial encounter: Secondary | ICD-10-CM | POA: Insufficient documentation

## 2024-01-30 DIAGNOSIS — M79604 Pain in right leg: Secondary | ICD-10-CM | POA: Insufficient documentation

## 2024-01-30 MED ORDER — NAPROXEN 500 MG PO TABS: 500.0000 mg | ORAL_TABLET | Freq: Two times a day (BID) | ORAL | 0 refills | Status: AC

## 2024-01-30 MED ORDER — METHOCARBAMOL 750 MG PO TABS: 750.0000 mg | ORAL_TABLET | Freq: Three times a day (TID) | ORAL | 0 refills | Status: AC
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
## 2024-01-30 NOTE — Discharge Instructions (Signed)
 Please follow-up closely with orthopedics for any continued symptoms.  Return to emergency department immediately for any new or worsening symptoms.

## 2024-01-30 NOTE — ED Notes (Signed)
 X-ray at bedside.

## 2024-02-01 ENCOUNTER — Telehealth: Payer: Self-pay

## 2024-02-01 NOTE — Telephone Encounter (Signed)
 Attempted follow up call to former [facility] client who was recently seen in ED on 01/30/24. No answer, left Voicemail requesting return call.   Jorge Mckinney Skeen RN [employer]

## 2024-02-12 ENCOUNTER — Telehealth: Payer: Self-pay

## 2024-02-12 NOTE — Telephone Encounter (Signed)
 Attempted follow up with recent ED visit. Formerly being seen by [facility]. No visits showing.  No answer today left message, requesting return call. Will mail [facility] information.   Avelina JONELLE Skeen RN [employer]
